# Patient Record
Sex: Female | Born: 1990 | Race: White | Hispanic: Yes | Marital: Married | Smoking: Never smoker
Health system: Southern US, Community
[De-identification: ages and names within clinical notes are randomized; demographics above are authoritative.]

## PROBLEM LIST (undated history)

## (undated) ENCOUNTER — Inpatient Hospital Stay (HOSPITAL_COMMUNITY): Payer: Self-pay

## (undated) DIAGNOSIS — J069 Acute upper respiratory infection, unspecified: Secondary | ICD-10-CM

## (undated) DIAGNOSIS — J45909 Unspecified asthma, uncomplicated: Secondary | ICD-10-CM

## (undated) HISTORY — DX: Unspecified asthma, uncomplicated: J45.909

## (undated) HISTORY — PX: TOENAIL EXCISION: SUR558

---
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
## 2011-04-08 ENCOUNTER — Inpatient Hospital Stay (INDEPENDENT_AMBULATORY_CARE_PROVIDER_SITE_OTHER)
Admission: RE | Admit: 2011-04-08 | Discharge: 2011-04-08 | Disposition: A | Discharge status: Home / Self Care | Payer: Self-pay | Source: Ambulatory Visit | Attending: Emergency Medicine | Admitting: Emergency Medicine

## 2011-04-08 DIAGNOSIS — L509 Urticaria, unspecified: Secondary | ICD-10-CM

## 2011-04-29 ENCOUNTER — Ambulatory Visit (INDEPENDENT_AMBULATORY_CARE_PROVIDER_SITE_OTHER): Payer: Self-pay | Admitting: Family Medicine

## 2011-04-29 ENCOUNTER — Encounter: Payer: Self-pay | Admitting: Family Medicine

## 2011-04-29 VITALS — BP 112/70 | HR 80 | Temp 98.1°F | Wt 165.6 lb

## 2011-04-29 DIAGNOSIS — J45909 Unspecified asthma, uncomplicated: Secondary | ICD-10-CM

## 2011-04-29 MED ORDER — ALBUTEROL SULFATE HFA 108 (90 BASE) MCG/ACT IN AERS: 2.0000 | INHALATION_SPRAY | RESPIRATORY_TRACT | Status: DC | PRN

## 2011-04-29 MED ORDER — AEROCHAMBER MAX W/MASK MEDIUM MISC: Status: DC

## 2011-04-29 MED ORDER — PREDNISONE 50 MG PO TABS: 50.0000 mg | ORAL_TABLET | Freq: Every day | ORAL | Status: DC

## 2011-04-29 MED ORDER — PREDNISONE 50 MG PO TABS: 50.0000 mg | ORAL_TABLET | Freq: Every day | ORAL | Status: AC

## 2011-04-29 MED ORDER — METHYLPREDNISOLONE SODIUM SUCC 40 MG IJ SOLR
40.0000 mg | Freq: Once | INTRAMUSCULAR | Status: AC
  Administered 2011-04-29: 40 mg via INTRAMUSCULAR

## 2011-04-29 MED ORDER — AEROCHAMBER MAX W/MASK MEDIUM MISC: Status: AC

## 2011-04-29 NOTE — Assessment & Plan Note (Signed)
Will give solumedrol dose IM and prescribe prednisone burst.  Also refill albuterol.  RTC for eval for more chronic need for inhaled steroids vs other controller.

## 2011-04-29 NOTE — Patient Instructions (Signed)
It was good to see you today! I am giving you some steroids by mouth to take for the next week. I want you to get back in to see [place] sometime in the next 2-3 weeks.  We can give you a flu shot at that time.

## 2011-04-29 NOTE — Progress Notes (Signed)
Subjective: Having problems with breathing, cough, sore throat, congestion, and back ache.  Has been bothering her for about 2 days.  Subjective fevers.  No N/V/D.  No sick contacts.  Is out of her albuterol and has been for some time.  Pt reports that she has had approximently 1 exacerbation every other year for the past number of years and has not had any recent hospitalizations for this condition.  Objective:  Filed Vitals:   04/29/11 1340  BP: 112/70  Pulse: 80  Temp: 98.1 F (36.7 C)   Gen: NAD CV: RRR Resp: Wheezes throughout, no focal findings, good air movement.  Assessment/Plan: Will give solumedrol dose IM and prescribe prednisone burst.  Also refill albuterol.  RTC for eval for more chronic need for inhaled steroids vs other controller. Please also see individual problems in problem list for problem-specific plans.

## 2011-05-18 ENCOUNTER — Ambulatory Visit (INDEPENDENT_AMBULATORY_CARE_PROVIDER_SITE_OTHER): Payer: Self-pay

## 2011-05-18 DIAGNOSIS — Z23 Encounter for immunization: Secondary | ICD-10-CM

## 2011-05-20 ENCOUNTER — Ambulatory Visit: Payer: Self-pay | Admitting: Family Medicine

## 2011-08-17 ENCOUNTER — Encounter: Payer: Self-pay | Admitting: Family Medicine

## 2011-08-17 ENCOUNTER — Ambulatory Visit (INDEPENDENT_AMBULATORY_CARE_PROVIDER_SITE_OTHER): Payer: Self-pay | Admitting: Family Medicine

## 2011-08-17 VITALS — BP 104/61 | HR 67 | Temp 98.1°F | Ht 66.0 in | Wt 166.9 lb

## 2011-08-17 DIAGNOSIS — R51 Headache: Secondary | ICD-10-CM

## 2011-08-17 MED ORDER — IBUPROFEN 800 MG PO TABS: 800.0000 mg | ORAL_TABLET | Freq: Three times a day (TID) | ORAL | Status: AC | PRN

## 2011-08-17 MED ORDER — SUMATRIPTAN SUCCINATE 50 MG PO TABS: 50.0000 mg | ORAL_TABLET | Freq: Once | ORAL | Status: DC | PRN

## 2011-08-17 NOTE — Progress Notes (Signed)
  Subjective:    Patient ID: Arbutus Leas, female    DOB: Aug 02, 1991, 21 y.o.   MRN: [id number]  HPI #1. Headaches: This is a new problem for [place]. She's been having headaches for the past several weeks. She describes 2 different types of headaches: The first of headache is described as unilateral and starts in the back of her head and neck. She states she has these every night. This can also be triggered by her children crying at home. These resolve the next morning. She has been taking Bayer migraine without relief.   She also describes a unilateral headache which can happen anytime during the day and often has no triggers. She does describe flashing lights on the right side of her vision prior to these headaches. These headaches often make her nauseous with associated photophobia and phonophobia. They last for several hours and are only relieved when she goes to lay down in a quiet dark room. She does have a father who suffers from migraines. No other family history. No actual vomiting. No abdominal pain.   Review of Systems See HPI above for review of systems.       Objective:   Physical Exam Gen:  Alert, cooperative patient who appears stated age in no acute distress.  Vital signs reviewed. Head:  [place]/AT Eyes:  PERRL, EOMI, fundoscopy WNL with normal cup to disc ratios Neck:  No thryomegaly. Cardiac:  Regular rate and rhythm without murmur auscultated.  Good S1/S2. Pulm:  Clear to auscultation bilaterally with good air movement.  No wheezes or rales noted.   Neuro:  Grossly normal without focal deficits      Assessment & Plan:

## 2011-08-17 NOTE — Patient Instructions (Signed)
It sounds like you have 2 different types of headaches:  Migraines and stress headaches.  I will give you some information about both.  Use the Imitrex if you see the funny lights before a migraine starts or as soon as it starts.  If the headache has already started, use the Ibuprofen for relief.    Migraine Headache  A migraine headache is an intense, throbbing pain on one or both sides of your head. The exact cause of a migraine headache is not always known. A migraine may be caused when nerves in the brain become irritated and release chemicals that cause swelling within blood vessels, causing pain. Many migraine sufferers have a family history of migraines. Before you get a migraine you may or may not get an aura. An aura is a group of symptoms that can predict the beginning of a migraine. An aura may include:  Visual changes such as:   Flashing lights.   Bright spots or zig-zag lines.   Tunnel vision.   Feelings of numbness.   Trouble talking.   Muscle weakness.  SYMPTOMS  Pain on one or both sides of your head.   Pain that is pulsating or throbbing in nature.   Pain that is severe enough to prevent daily activities.   Pain that is aggravated by any daily physical activity.   Nausea (feeling sick to your stomach), vomiting, or both.   Pain with exposure to bright lights, loud noises, or activity.   General sensitivity to bright lights or loud noises.  MIGRAINE TRIGGERS Examples of triggers of migraine headaches include:   Alcohol.   Smoking.   Stress.   It may be related to menses (female menstruation).   Aged cheeses.   Foods or drinks that contain nitrates, glutamate, aspartame, or tyramine.   Lack of sleep.   Chocolate.   Caffeine.   Hunger.   Medications such as nitroglycerine (used to treat chest pain), birth control pills, estrogen, and some blood pressure medications.  DIAGNOSIS  A migraine headache is often diagnosed based on:  Symptoms.     Physical examination.   A computerized X-ray scan (computed tomography, CT) of your head.  TREATMENT  Medications can help prevent migraines if they are recurrent or should they become recurrent. Your caregiver can help you with a medication or treatment program that will be helpful to you.   Lying down in a dark, quiet room may be helpful.   Keeping a headache diary may help you find a trend as to what may be triggering your headaches.  SEEK IMMEDIATE MEDICAL CARE IF:   You have confusion, personality changes or seizures.   You have headaches that wake you from sleep.   You have an increased frequency in your headaches.   You have a stiff neck.   You have a loss of vision.   You have muscle weakness.   You start losing your balance or have trouble walking.   You feel faint or pass out.  MAKE SURE YOU:   Understand these instructions.   Will watch your condition.   Will get help right away if you are not doing well or get worse.  Document Released: 07/25/2005 Document Revised: 04/06/2011 Document Reviewed: 03/10/2009 [facility] Patient Information 2012 [place], [place].   Tension Headache (Muscle Contraction Headache)  Tension headache is one of the most common causes of head pain. These headaches are usually felt as a pain over the top of your head and back of your neck.  Stress, anxiety, and depression are common triggers for these headaches. Tension headaches are not life-threatening and will not lead to other types of headaches. Tension headaches can often be diagnosed by taking a history from the patient and a physical exam. Sometimes, further lab and x-ray studies are used to confirm the diagnosis. Your caregiver can advise you on how to get help solving problems that cause anxiety or stress. Antidepressants can be prescribed if depression is a problem. HOME CARE INSTRUCTIONS   If testing was done, call for your results. Remember, it is your responsibility to get the  results of all testing. Do not assume everything is fine because you do not hear from your caregiver.   Only take over-the-counter or prescription medicines for pain, discomfort, or fever as directed by your caregiver.   Biofeedback, massage, or other relaxation techniques may be helpful.   Ice packs or heat to the head and neck can be used. Use these three to four times per day or as needed.   Physical therapy may be a useful addition to treatment.   If headaches continue, even with therapy, you may need to think about lifestyle changes.   Avoid excessive use of pain killers, as rebound headaches can occur.  SEEK MEDICAL CARE IF:   You develop problems with medications prescribed.   You do not respond or get no relief from medications.   You have a change from the usual headache.   You develop nausea (feeling sick to your stomach) or vomiting.  SEEK IMMEDIATE MEDICAL CARE IF:   Your headache becomes severe.   You have an unexplained oral temperature above 102 F (38.9 C).   You develop a stiff neck.   You have loss of vision.   You have muscular weakness.   You have loss of muscular control.   You develop severe symptoms different from your first symptoms.   You start losing your balance or have trouble walking.   You feel faint or pass out.  MAKE SURE YOU:   Understand these instructions.   Will watch your condition.   Will get help right away if you are not doing well or get worse.  Document Released: 07/25/2005 Document Revised: 04/06/2011 Document Reviewed: 03/13/2008 [facility] Patient Information 2012 [place], [place].

## 2011-08-17 NOTE — Assessment & Plan Note (Signed)
I would say that she is suffering from 2 different headaches: Migraines and tension headaches. I gave her a handout about these 2 different types of headaches. I gave her a prescription for Imitrex for the migraines and prescription of ibuprofen for the tension headaches. We did discuss the symptoms of these 2 types of headaches and when to use each of the medications. She expressed understanding. No red flags by history or exam. She is to followup if she has any worsening or nonresolution of symptoms.

## 2012-07-16 ENCOUNTER — Ambulatory Visit: Payer: Self-pay | Admitting: Family Medicine

## 2012-12-17 ENCOUNTER — Encounter (HOSPITAL_COMMUNITY): Payer: Self-pay | Admitting: *Deleted

## 2012-12-17 ENCOUNTER — Inpatient Hospital Stay (HOSPITAL_COMMUNITY)
Admission: AD | Admit: 2012-12-17 | Discharge: 2012-12-17 | Disposition: A | Discharge status: Home / Self Care | Payer: Self-pay | Source: Ambulatory Visit | Attending: Obstetrics & Gynecology | Admitting: Obstetrics & Gynecology

## 2012-12-17 DIAGNOSIS — K625 Hemorrhage of anus and rectum: Secondary | ICD-10-CM | POA: Insufficient documentation

## 2012-12-17 DIAGNOSIS — K644 Residual hemorrhoidal skin tags: Secondary | ICD-10-CM | POA: Insufficient documentation

## 2012-12-17 HISTORY — DX: Acute upper respiratory infection, unspecified: J06.9

## 2012-12-17 LAB — URINE MICROSCOPIC-ADD ON

## 2012-12-17 LAB — URINALYSIS, ROUTINE W REFLEX MICROSCOPIC
Bilirubin Urine: NEGATIVE
Glucose, UA: NEGATIVE mg/dL
Ketones, ur: 15 mg/dL — AB
Protein, ur: NEGATIVE mg/dL

## 2012-12-17 LAB — POCT PREGNANCY, URINE: Preg Test, Ur: NEGATIVE

## 2012-12-17 MED ORDER — DOCUSATE SODIUM 100 MG PO CAPS: 100.0000 mg | ORAL_CAPSULE | Freq: Two times a day (BID) | ORAL | Status: DC

## 2012-12-17 NOTE — MAU Provider Note (Signed)
History     CSN: [id number]  Arrival date and time: 12/17/12 2030   First Provider Initiated Contact with Patient 12/17/12 2201      Chief Complaint  Patient presents with  . Rectal Bleeding   HPI Ms. Natalie Morgan is a 22 y.o. G1P1001 who presents to MAU today with complaint of rectal bleeding. The patient states that this occurs only with BM. She has BM every 2 days, which is not normal for her. This started last week. She had mild abdominal pain with the first episode, but none since. She denies changes in diet. She feels well hydrated. She denies melana, diarrhea or heartburn. She has some pain with BM, but not every time. She is a patient of [facility]. She called them today and they did not return her call.    OB History   Grav Para Term Preterm Abortions TAB SAB Ect Mult Living   1 1 1       1       Past Medical History  Diagnosis Date  . Recurrent upper respiratory infection (URI)     Past Surgical History  Procedure Laterality Date  . Toenail excision      Family History  Problem Relation Age of Onset  . Cancer Maternal Aunt     History  Substance Use Topics  . Smoking status: Never Smoker   . Smokeless tobacco: Not on file  . Alcohol Use: No    Allergies: No Known Allergies  Prescriptions prior to admission  Medication Sig Dispense Refill  . aspirin-acetaminophen-caffeine (EXCEDRIN MIGRAINE) 250-250-65 MG per tablet Take 1 tablet by mouth every 6 (six) hours as needed for pain.      Marland Kitchen albuterol (VENTOLIN HFA) 108 (90 BASE) MCG/ACT inhaler Inhale 2 puffs into the lungs as needed. For asthma  3.7 g  1  . SUMAtriptan (IMITREX) 50 MG tablet Take 1 tablet (50 mg total) by mouth once as needed for migraine.  20 tablet  1    Review of Systems  Constitutional: Negative for fever, chills and malaise/fatigue.  Gastrointestinal: Positive for nausea, constipation and blood in stool. Negative for heartburn, vomiting, abdominal pain, diarrhea and melena.   Genitourinary: Negative for dysuria, urgency and frequency.       Neg - vaginal bleeding   Physical Exam   Blood pressure 123/69, pulse 78, temperature 98.2 F (36.8 C), temperature source Oral, resp. rate 16, height 5\' 6"  (1.676 m), weight 170 lb 3.2 oz (77.202 kg), last menstrual period 11/26/2012.  Physical Exam  Constitutional: She is oriented to person, place, and time. She appears well-developed and well-nourished. No distress.  HENT:  Head: Normocephalic and atraumatic.  Cardiovascular: Normal rate, regular rhythm and normal heart sounds.   Respiratory: Effort normal and breath sounds normal. No respiratory distress.  GI: Soft. Bowel sounds are normal. She exhibits no distension and no mass. There is tenderness (very mild tenderness to palpation of the LLQ). There is no rebound and no guarding.  Genitourinary: Rectal exam shows external hemorrhoid. Rectal exam shows no fissure, no mass and no tenderness.  Neurological: She is alert and oriented to person, place, and time.  Skin: Skin is warm and dry. No erythema.  Psychiatric: She has a normal mood and affect.   Results for orders placed during the hospital encounter of 12/17/12 (from the past 24 hour(s))  URINALYSIS, ROUTINE W REFLEX MICROSCOPIC     Status: Abnormal   Collection Time    12/17/12  9:20 PM  Result Value Range   Color, Urine YELLOW  YELLOW   APPearance CLEAR  CLEAR   Specific Gravity, Urine >1.030 (*) 1.005 - 1.030   pH 5.5  5.0 - 8.0   Glucose, UA NEGATIVE  NEGATIVE mg/dL   Hgb urine dipstick TRACE (*) NEGATIVE   Bilirubin Urine NEGATIVE  NEGATIVE   Ketones, ur 15 (*) NEGATIVE mg/dL   Protein, ur NEGATIVE  NEGATIVE mg/dL   Urobilinogen, UA 0.2  0.0 - 1.0 mg/dL   Nitrite NEGATIVE  NEGATIVE   Leukocytes, UA SMALL (*) NEGATIVE  URINE MICROSCOPIC-ADD ON     Status: Abnormal   Collection Time    12/17/12  9:20 PM      Result Value Range   Squamous Epithelial / LPF FEW (*) RARE   WBC, UA 3-6  <3  WBC/hpf   RBC / HPF 0-2  <3 RBC/hpf   Bacteria, UA FEW (*) RARE  POCT PREGNANCY, URINE     Status: None   Collection Time    12/17/12  9:48 PM      Result Value Range   Preg Test, Ur NEGATIVE  NEGATIVE    MAU Course  Procedures None  MDM External hemorrhoid noted on exam. Discussed possible causes. Patient encouraged to follow-up at [facility] if symptoms worsen  Assessment and Plan  A: External hemorrhoid  P: Discharge home Rx for colace sent to patient's pharmacy Patient encouraged to use preparation H OTC for discomfort Patient encouraged to increase fiber in diet. High fiber content food information given on AVS Patient encouraged to increase PO hydration as tolerated Patient encouraged to follow-up with [facility] if symptoms worsen  Freddi Starr, PA-C  12/17/2012, 10:26 PM

## 2012-12-17 NOTE — MAU Note (Signed)
Pt LMP 11/26/2012, having rectal bleeding after each bowel movement x 1 wk.  Pt reports blood in her stools x 1 wk.

## 2012-12-18 LAB — URINE CULTURE

## 2012-12-21 ENCOUNTER — Ambulatory Visit (INDEPENDENT_AMBULATORY_CARE_PROVIDER_SITE_OTHER): Payer: PRIVATE HEALTH INSURANCE | Admitting: Family Medicine

## 2012-12-21 ENCOUNTER — Encounter: Payer: Self-pay | Admitting: Family Medicine

## 2012-12-21 VITALS — BP 120/80 | HR 72 | Ht 66.0 in | Wt 168.5 lb

## 2012-12-21 DIAGNOSIS — K649 Unspecified hemorrhoids: Secondary | ICD-10-CM | POA: Insufficient documentation

## 2012-12-21 DIAGNOSIS — Z862 Personal history of diseases of the blood and blood-forming organs and certain disorders involving the immune mechanism: Secondary | ICD-10-CM

## 2012-12-21 LAB — POCT HEMOGLOBIN: Hemoglobin: 13.5 g/dL (ref 12.2–16.2)

## 2012-12-21 NOTE — Assessment & Plan Note (Signed)
External hemorrhoid noted at acute visit for rectal bleeding at [facility].  Patient reports resolution of pain and bleeding with improvement in constipation with prune juice.  Advised miralax titration prn for soft BM's.

## 2012-12-21 NOTE — Progress Notes (Signed)
  Subjective:    Patient ID: Natalie Morgan, female    DOB: May 11, 1991, 22 y.o.   MRN: [id number]  HPIFollow-up Hemorrhoid dx at [facility]  For the past week has had increased constipation and noticed BRB with bowel movements around a stool. Brings in a pictures- showing red in toilet around a stool. Some pain with bowel movement.  Last bleeding 3 days ago.  No more pain.  Was evaluated at [facility] hospital for this, they noted external hemorrhoid and discharged with colace.  Typically has BM daily, recently every other day.  Hard BM's, has softened with prune juice.  Not taking colace or any other medications for constipation.    Has history of intermittent constipation since pregnancy.  This is her first episode of rectal bleeding.  Also noted she is not on contraception because she may try to conceive soon  Reports had history of anemia, would like this rechecked today.  Review of Systems See HPI     Objective:   Physical Exam GEN: NAD Rectal exam: deferred.      Assessment & Plan:

## 2012-12-21 NOTE — Patient Instructions (Addendum)
Use miralax daily or every other day- adjust dose to keep your stools soft every day  calll office for appointment if you notice more bleeding or pain  Take a prenatal vitamin every day  Make appointment for yearly checkup

## 2012-12-24 NOTE — MAU Provider Note (Signed)
Attestation of Attending Supervision of Advanced Practitioner (CNM/NP): Evaluation and management procedures were performed by the Advanced Practitioner under my supervision and collaboration. I have reviewed the Advanced Practitioner's note and chart, and I agree with the management and plan.  Ruthella Kirchman H. 4:26 PM   

## 2013-01-15 ENCOUNTER — Encounter: Payer: Self-pay | Admitting: Family Medicine

## 2013-03-27 ENCOUNTER — Other Ambulatory Visit: Payer: PRIVATE HEALTH INSURANCE

## 2013-03-27 DIAGNOSIS — Z331 Pregnant state, incidental: Secondary | ICD-10-CM

## 2013-03-27 LAB — OB RESULTS CONSOLE GC/CHLAMYDIA
CHLAMYDIA, DNA PROBE: NEGATIVE
Gonorrhea: NEGATIVE

## 2013-03-27 NOTE — Progress Notes (Signed)
OB LABS DONE TODAY Natalie Morgan 

## 2013-03-29 LAB — OBSTETRIC PANEL
Antibody Screen: NEGATIVE
Basophils Relative: 0 % (ref 0–1)
HCT: 36.2 % (ref 36.0–46.0)
Hemoglobin: 12.3 g/dL (ref 12.0–15.0)
MCHC: 34 g/dL (ref 30.0–36.0)
Monocytes Absolute: 0.6 10*3/uL (ref 0.1–1.0)
Monocytes Relative: 7 % (ref 3–12)
Neutro Abs: 6.1 10*3/uL (ref 1.7–7.7)
Rh Type: POSITIVE
Rubella: 2.12 Index — ABNORMAL HIGH (ref <0.90)

## 2013-03-29 LAB — CULTURE, OB URINE: Colony Count: 50000

## 2013-04-03 ENCOUNTER — Ambulatory Visit (INDEPENDENT_AMBULATORY_CARE_PROVIDER_SITE_OTHER): Payer: PRIVATE HEALTH INSURANCE | Admitting: Sports Medicine

## 2013-04-03 ENCOUNTER — Encounter: Payer: Self-pay | Admitting: Sports Medicine

## 2013-04-03 ENCOUNTER — Other Ambulatory Visit (HOSPITAL_COMMUNITY)
Admission: RE | Admit: 2013-04-03 | Discharge: 2013-04-03 | Disposition: A | Discharge status: Home / Self Care | Payer: PRIVATE HEALTH INSURANCE | Source: Ambulatory Visit | Attending: Sports Medicine | Admitting: Sports Medicine

## 2013-04-03 VITALS — BP 102/57 | Temp 98.1°F | Wt 169.1 lb

## 2013-04-03 DIAGNOSIS — Z3481 Encounter for supervision of other normal pregnancy, first trimester: Secondary | ICD-10-CM

## 2013-04-03 DIAGNOSIS — Z3480 Encounter for supervision of other normal pregnancy, unspecified trimester: Secondary | ICD-10-CM | POA: Insufficient documentation

## 2013-04-03 DIAGNOSIS — Z113 Encounter for screening for infections with a predominantly sexual mode of transmission: Secondary | ICD-10-CM | POA: Insufficient documentation

## 2013-04-03 MED ORDER — PRENATAL PLUS 27-1 MG PO TABS: 1.0000 | ORAL_TABLET | Freq: Every day | ORAL | Status: DC

## 2013-04-03 NOTE — Progress Notes (Signed)
Pt is a 22 y.o. G2P1001 [redacted]w[redacted]d by LMP who presents today for initial prenatal visit.  Subjective:    Natalie Morgan is being seen today for her first obstetrical visit.  This is a planned pregnancy. She is at [redacted]w[redacted]d gestation. Her obstetrical history is significant for none. Relationship with FOB: spouse, living together. Patient does intend to breast feed. Pregnancy history fully reviewed.  Patient reports no complaints.  Review of Systems:   Review of Systems  Constitutional: Negative.   HENT: Negative.   Eyes: Negative.   Respiratory: Negative.   Cardiovascular: Negative.   Gastrointestinal: Negative.   Endocrine: Negative.   Genitourinary: Negative.   Musculoskeletal: Negative.   Skin: Negative.   Allergic/Immunologic: Negative.   Neurological: Negative.  Negative for headaches.  Hematological: Negative.   Psychiatric/Behavioral: Negative.     Objective:     BP 102/57  Temp(Src) 98.1 F (36.7 C)  Wt 169 lb 1.6 oz (76.703 kg)  BMI 27.31 kg/m2  LMP 01/25/2013 Physical Exam  Nursing note and vitals reviewed. Constitutional: She is oriented to person, place, and time. She appears well-developed and well-nourished. No distress.  HENT:  Head: Normocephalic and atraumatic.  Eyes: Conjunctivae are normal. Pupils are equal, round, and reactive to light.  Neck: Normal range of motion. No JVD present. No tracheal deviation present.  Cardiovascular: Normal rate.  Exam reveals no gallop and no friction rub.   No murmur heard. Respiratory: Effort normal. No respiratory distress. She has no wheezes. She has no rales.  GI: Soft. She exhibits distension (Gravid).  Musculoskeletal: She exhibits no edema and no tenderness.  Neurological: She is alert and oriented to person, place, and time. She exhibits normal muscle tone.  Skin: Skin is warm and dry. No rash noted. She is not diaphoretic. No erythema. No pallor.  Psychiatric: She has a normal mood and affect. Her behavior is  normal. Judgment and thought content normal.    Exam Chaperone: CMA  External: no skin lesions, normal appearing genitalia, minimal discharge  Vagina: no vaginal lesion, vaginal mucosa moist   Cervix:  cervical ectropion, no CMT  Uterus: normal size, shape, consistency  non-tender  Adenexa: no masses, non-tender  TESTS:  PAP SMEAR, GC Chlamydia      Assessment:    Pregnancy: G2P1001 Normal low risk pregnancy     Plan:     Initial labs drawn previously N. reviewed Prenatal vitamins. Problem list reviewed and updated. AFP3 discussed: requested. Role of ultrasound in pregnancy discussed; fetal survey: requested.  Will likely need be done at [facility] Amniocentesis discussed: not indicated. Follow up in 3 weeks. 50% of 45 min visit spent on counseling and coordination of care.  Refilled Prenatal Vitamins   Patient will need Varicella titer at 16 week blood draw and flu shot at next visit - she deferred this today.  Chi Garlow 04/03/2013

## 2013-04-03 NOTE — Patient Instructions (Addendum)
I have refilled your prenatal vitamin. Please follow up in 2-3 weeks for your 12 week visit  ABCs of Pregnancy A Antepartum care is very important. Be sure you see your doctor and get prenatal care as soon as you think you are pregnant. At this time, you will be tested for infection, genetic abnormalities and potential problems with you and the pregnancy. This is the time to discuss diet, exercise, work, medications, labor, pain medication during labor and the possibility of a cesarean delivery. Ask any questions that may concern you. It is important to see your doctor regularly throughout your pregnancy. Avoid exposure to toxic substances and chemicals - such as cleaning solvents, lead and mercury, some insecticides, and paint. Pregnant women should avoid exposure to paint fumes, and fumes that cause you to feel ill, dizzy or faint. When possible, it is a good idea to have a pre-pregnancy consultation with your caregiver to begin some important recommendations your caregiver suggests such as, taking folic acid, exercising, quitting smoking, avoiding alcoholic beverages, etc. B Breastfeeding is the healthiest choice for both you and your baby. It has many nutritional benefits for the baby and health benefits for the mother. It also creates a very tight and loving bond between the baby and mother. Talk to your doctor, your family and friends, and your employer about how you choose to feed your baby and how they can support you in your decision. Not all birth defects can be prevented, but a woman can take actions that may increase her chance of having a healthy baby. Many birth defects happen very early in pregnancy, sometimes before a woman even knows she is pregnant. Birth defects or abnormalities of any child in your or the father's family should be discussed with your caregiver. Get a good support bra as your breast size changes. Wear it especially when you exercise and when nursing.  C Celebrate the news  of your pregnancy with the your spouse/father and family. Childbirth classes are helpful to take for you and the spouse/father because it helps to understand what happens during the pregnancy, labor and delivery. Cesarean delivery should be discussed with your doctor so you are prepared for that possibility. The pros and cons of circumcision if it is a boy, should be discussed with your pediatrician. Cigarette smoking during pregnancy can result in low birth weight babies. It has been associated with infertility, miscarriages, tubal pregnancies, infant death (mortality) and poor health (morbidity) in childhood. Additionally, cigarette smoking may cause long-term learning disabilities. If you smoke, you should try to quit before getting pregnant and not smoke during the pregnancy. Secondary smoke may also harm a mother and her developing baby. It is a good idea to ask people to stop smoking around you during your pregnancy and after the baby is born. Extra calcium is necessary when you are pregnant and is found in your prenatal vitamin, in dairy products, green leafy vegetables and in calcium supplements. D A healthy diet according to your current weight and height, along with vitamins and mineral supplements should be discussed with your caregiver. Domestic abuse or violence should be made known to your doctor right away to get the situation corrected. Drink more water when you exercise to keep hydrated. Discomfort of your back and legs usually develops and progresses from the middle of the second trimester through to delivery of the baby. This is because of the enlarging baby and uterus, which may also affect your balance. Do not take illegal drugs. Illegal  drugs can seriously harm the baby and you. Drink extra fluids (water is best) throughout pregnancy to help your body keep up with the increases in your blood volume. Drink at least 6 to 8 glasses of water, fruit juice, or milk each day. A good way to know you  are drinking enough fluid is when your urine looks almost like clear water or is very light yellow.  E Eat healthy to get the nutrients you and your unborn baby need. Your meals should include the five basic food groups. Exercise (30 minutes of light to moderate exercise a day) is important and encouraged during pregnancy, if there are no medical problems or problems with the pregnancy. Exercise that causes discomfort or dizziness should be stopped and reported to your caregiver. Emotions during pregnancy can change from being ecstatic to depression and should be understood by you, your partner and your family. F Fetal screening with ultrasound, amniocentesis and monitoring during pregnancy and labor is common and sometimes necessary. Take 400 micrograms of folic acid daily both before, when possible, and during the first few months of pregnancy to reduce the risk of birth defects of the brain and spine. All women who could possibly become pregnant should take a vitamin with folic acid, every day. It is also important to eat a healthy diet with fortified foods (enriched grain products, including cereals, rice, breads, and pastas) and foods with natural sources of folate (orange juice, green leafy vegetables, beans, peanuts, broccoli, asparagus, peas, and lentils). The father should be involved with all aspects of the pregnancy including, the prenatal care, childbirth classes, labor, delivery, and postpartum time. Fathers may also have emotional concerns about being a father, financial needs, and raising a family. G Genetic testing should be done appropriately. It is important to know your family and the father's history. If there have been problems with pregnancies or birth defects in your family, report these to your doctor. Also, genetic counselors can talk with you about the information you might need in making decisions about having a family. You can call a major medical center in your area for help in  finding a board-certified genetic counselor. Genetic testing and counseling should be done before pregnancy when possible, especially if there is a history of problems in the mother's or father's family. Certain ethnic backgrounds are more at risk for genetic defects. H Get familiar with the hospital where you will be having your baby. Get to know how long it takes to get there, the labor and delivery area, and the hospital procedures. Be sure your medical insurance is accepted there. Get your home ready for the baby including, clothes, the baby's room (when possible), furniture and car seat. Hand washing is important throughout the day, especially after handling raw meat and poultry, changing the baby's diaper or using the bathroom. This can help prevent the spread of many bacteria and viruses that cause infection. Your hair may become dry and thinner, but will return to normal a few weeks after the baby is born. Heartburn is a common problem that can be treated by taking antacids recommended by your caregiver, eating smaller meals 5 or 6 times a day, not drinking liquids when eating, drinking between meals and raising the head of your bed 2 to 3 inches. I Insurance to cover you, the baby, doctor and hospital should be reviewed so that you will be prepared to pay any costs not covered by your insurance plan. If you do not have medical insurance,  there are usually clinics and services available for you in your community. Take 30 milligrams of iron during your pregnancy as prescribed by your doctor to reduce the risk of low red blood cells (anemia) later in pregnancy. All women of childbearing age should eat a diet rich in iron. J There should be a joint effort for the mother, father and any other children to adapt to the pregnancy financially, emotionally, and psychologically during the pregnancy. Join a support group for moms-to-be. Or, join a class on parenting or childbirth. Have the family participate when  possible. K Know your limits. Let your caregiver know if you experience any of the following:   Pain of any kind.  Strong cramps.  You develop a lot of weight in a short period of time (5 pounds in 3 to 5 days).  Vaginal bleeding, leaking of amniotic fluid.  Headache, vision problems.  Dizziness, fainting, shortness of breath.  Chest pain.  Fever of 102 F (38.9 C) or higher.  Gush of clear fluid from your vagina.  Painful urination.  Domestic violence.  Irregular heartbeat (palpitations).  Rapid beating of the heart (tachycardia).  Constant feeling sick to your stomach (nauseous) and vomiting.  Trouble walking, fluid retention (edema).  Muscle weakness.  If your baby has decreased activity.  Persistent diarrhea.  Abnormal vaginal discharge.  Uterine contractions at 20-minute intervals.  Back pain that travels down your leg. L Learn and practice that what you eat and drink should be in moderation and healthy for you and your baby. Legal drugs such as alcohol and caffeine are important issues for pregnant women. There is no safe amount of alcohol a woman can drink while pregnant. Fetal alcohol syndrome, a disorder characterized by growth retardation, facial abnormalities, and central nervous system dysfunction, is caused by a woman's use of alcohol during pregnancy. Caffeine, found in tea, coffee, soft drinks and chocolate, should also be limited. Be sure to read labels when trying to cut down on caffeine during pregnancy. More than 200 foods, beverages, and over-the-counter medications contain caffeine and have a high salt content! There are coffees and teas that do not contain caffeine. M Medical conditions such as diabetes, epilepsy, and high blood pressure should be treated and kept under control before pregnancy when possible, but especially during pregnancy. Ask your caregiver about any medications that may need to be changed or adjusted during pregnancy. If you  are currently taking any medications, ask your caregiver if it is safe to take them while you are pregnant or before getting pregnant when possible. Also, be sure to discuss any herbs or vitamins you are taking. They are medicines, too! Discuss with your doctor all medications, prescribed and over-the-counter, that you are taking. During your prenatal visit, discuss the medications your doctor may give you during labor and delivery. N Never be afraid to ask your doctor or caregiver questions about your health, the progress of the pregnancy, family problems, stressful situations, and recommendation for a pediatrician, if you do not have one. It is better to take all precautions and discuss any questions or concerns you may have during your office visits. It is a good idea to write down your questions before you visit the doctor. O Over-the-counter cough and cold remedies may contain alcohol or other ingredients that should be avoided during pregnancy. Ask your caregiver about prescription, herbs or over-the-counter medications that you are taking or may consider taking while pregnant.  P Physical activity during pregnancy can benefit both you  and your baby by lessening discomfort and fatigue, providing a sense of well-being, and increasing the likelihood of early recovery after delivery. Light to moderate exercise during pregnancy strengthens the belly (abdominal) and back muscles. This helps improve posture. Practicing yoga, walking, swimming, and cycling on a stationary bicycle are usually safe exercises for pregnant women. Avoid scuba diving, exercise at high altitudes (over 3000 feet), skiing, horseback riding, contact sports, etc. Always check with your doctor before beginning any kind of exercise, especially during pregnancy and especially if you did not exercise before getting pregnant. Q Queasiness, stomach upset and morning sickness are common during pregnancy. Eating a couple of crackers or dry  toast before getting out of bed. Foods that you normally love may make you feel sick to your stomach. You may need to substitute other nutritious foods. Eating 5 or 6 small meals a day instead of 3 large ones may make you feel better. Do not drink with your meals, drink between meals. Questions that you have should be written down and asked during your prenatal visits. R Read about and make plans to baby-proof your home. There are important tips for making your home a safer environment for your baby. Review the tips and make your home safer for you and your baby. Read food labels regarding calories, salt and fat content in the food. S Saunas, hot tubs, and steam rooms should be avoided while you are pregnant. Excessive high heat may be harmful during your pregnancy. Your caregiver will screen and examine you for sexually transmitted diseases and genetic disorders during your prenatal visits. Learn the signs of labor. Sexual relations while pregnant is safe unless there is a medical or pregnancy problem and your caregiver advises against it. T Traveling long distances should be avoided especially in the third trimester of your pregnancy. If you do have to travel out of state, be sure to take a copy of your medical records and medical insurance plan with you. You should not travel long distances without seeing your doctor first. Most airlines will not allow you to travel after 36 weeks of pregnancy. Toxoplasmosis is an infection caused by a parasite that can seriously harm an unborn baby. Avoid eating undercooked meat and handling cat litter. Be sure to wear gloves when gardening. Tingling of the hands and fingers is not unusual and is due to fluid retention. This will go away after the baby is born. U Womb (uterus) size increases during the first trimester. Your kidneys will begin to function more efficiently. This may cause you to feel the need to urinate more often. You may also leak urine when sneezing,  coughing or laughing. This is due to the growing uterus pressing against your bladder, which lies directly in front of and slightly under the uterus during the first few months of pregnancy. If you experience burning along with frequency of urination or bloody urine, be sure to tell your doctor. The size of your uterus in the third trimester may cause a problem with your balance. It is advisable to maintain good posture and avoid wearing high heels during this time. An ultrasound of your baby may be necessary during your pregnancy and is safe for you and your baby. V Vaccinations are an important concern for pregnant women. Get needed vaccines before pregnancy. Center for Disease Control ([employer]) has clear guidelines for the use of vaccines during pregnancy. Review the list, be sure to discuss it with your doctor. Prenatal vitamins are helpful and healthy for  you and the baby. Do not take extra vitamins except what is recommended. Taking too much of certain vitamins can cause overdose problems. Continuous vomiting should be reported to your caregiver. Varicose veins may appear especially if there is a family history of varicose veins. They should subside after the delivery of the baby. Support hose helps if there is leg discomfort. W Being overweight or underweight during pregnancy may cause problems. Try to get within 15 pounds of your ideal weight before pregnancy. Remember, pregnancy is not a time to be dieting! Do not stop eating or start skipping meals as your weight increases. Both you and your baby need the calories and nutrition you receive from a healthy diet. Be sure to consult with your doctor about your diet. There is a formula and diet plan available depending on whether you are overweight or underweight. Your caregiver or nutritionist can help and advise you if necessary. X Avoid X-rays. If you must have dental work or diagnostic tests, tell your dentist or physician that you are pregnant so  that extra care can be taken. X-rays should only be taken when the risks of not taking them outweigh the risk of taking them. If needed, only the minimum amount of radiation should be used. When X-rays are necessary, protective lead shields should be used to cover areas of the body that are not being X-rayed. Y Your baby loves you. Breastfeeding your baby creates a loving and very close bond between the two of you. Give your baby a healthy environment to live in while you are pregnant. Infants and children require constant care and guidance. Their health and safety should be carefully watched at all times. After the baby is born, rest or take a nap when the baby is sleeping. Z Get your ZZZs. Be sure to get plenty of rest. Resting on your side as often as possible, especially on your left side is advised. It provides the best circulation to your baby and helps reduce swelling. Try taking a nap for 30 to 45 minutes in the afternoon when possible. After the baby is born rest or take a nap when the baby is sleeping. Try elevating your feet for that amount of time when possible. It helps the circulation in your legs and helps reduce swelling.  Most information courtesy of the CDC. Document Released: 07/25/2005 Document Revised: 10/17/2011 Document Reviewed: 04/08/2009 [facility] Patient Information 2014 [place], [place].

## 2013-04-05 LAB — CULTURE, OB URINE
Colony Count: NO GROWTH
Organism ID, Bacteria: NO GROWTH

## 2013-04-09 ENCOUNTER — Encounter: Payer: Self-pay | Admitting: Sports Medicine

## 2013-04-15 ENCOUNTER — Telehealth: Payer: Self-pay | Admitting: Family Medicine

## 2013-04-15 MED ORDER — PRENATAL PLUS 27-1 MG PO TABS: 1.0000 | ORAL_TABLET | Freq: Every day | ORAL | Status: DC

## 2013-04-15 NOTE — Telephone Encounter (Signed)
Pt called because [facility] is stating that we di not send a prescription for pre-natal vitamins, it shows that we did on the 8/27, can we resend this JW

## 2013-04-15 NOTE — Telephone Encounter (Signed)
Resent to [facility] on [email or web address]. [place], [place]

## 2013-04-24 ENCOUNTER — Ambulatory Visit (INDEPENDENT_AMBULATORY_CARE_PROVIDER_SITE_OTHER): Payer: PRIVATE HEALTH INSURANCE | Admitting: Family Medicine

## 2013-04-24 VITALS — BP 110/67 | Wt 170.0 lb

## 2013-04-24 DIAGNOSIS — Z3481 Encounter for supervision of other normal pregnancy, first trimester: Secondary | ICD-10-CM

## 2013-04-24 DIAGNOSIS — Z3480 Encounter for supervision of other normal pregnancy, unspecified trimester: Secondary | ICD-10-CM

## 2013-04-24 NOTE — Patient Instructions (Addendum)
Pregnancy - First Trimester During sexual intercourse, millions of sperm go into the vagina. Only 1 sperm will penetrate and fertilize the female egg while it is in the Fallopian tube. One week later, the fertilized egg implants into the wall of the uterus. An embryo begins to develop into a baby. At 6 to 8 weeks, the eyes and face are formed and the heartbeat can be seen on ultrasound. At the end of 12 weeks (first trimester), all the baby's organs are formed. Now that you are pregnant, you will want to do everything you can to have a healthy baby. Two of the most important things are to get good prenatal care and follow your caregiver's instructions. Prenatal care is all the medical care you receive before the baby's birth. It is given to prevent, find, and treat problems during the pregnancy and childbirth. PRENATAL EXAMS  During prenatal visits, your weight, blood pressure, and urine are checked. This is done to make sure you are healthy and progressing normally during the pregnancy.  A pregnant woman should gain 25 to 35 pounds during the pregnancy. However, if you are overweight or underweight, your caregiver will advise you regarding your weight.  Your caregiver will ask and answer questions for you.  Blood work, cervical cultures, other necessary tests, and a Pap test are done during your prenatal exams. These tests are done to check on your health and the probable health of your baby. Tests are strongly recommended and done for HIV with your permission. This is the virus that causes AIDS. These tests are done because medicines can be given to help prevent your baby from being born with this infection should you have been infected without knowing it. Blood work is also used to find out your blood type, previous infections, and follow your blood levels (hemoglobin).  Low hemoglobin (anemia) is common during pregnancy. Iron and vitamins are given to help prevent this. Later in the pregnancy, blood  tests for diabetes will be done along with any other tests if any problems develop.  You may need other tests to make sure you and the baby are doing well. CHANGES DURING THE FIRST TRIMESTER  Your body goes through many changes during pregnancy. They vary from person to person. Talk to your caregiver about changes you notice and are concerned about. Changes can include:  Your menstrual period stops.  The egg and sperm carry the genes that determine what you look like. Genes from you and your partner are forming a baby. The female genes determine whether the baby is a boy or a girl.  Your body increases in girth and you may feel bloated.  Feeling sick to your stomach (nauseous) and throwing up (vomiting). If the vomiting is uncontrollable, call your caregiver.  Your breasts will begin to enlarge and become tender.  Your nipples may stick out more and become darker.  The need to urinate more. Painful urination may mean you have a bladder infection.  Tiring easily.  Loss of appetite.  Cravings for certain kinds of food.  At first, you may gain or lose a couple of pounds.  You may have changes in your emotions from day to day (excited to be pregnant or concerned something may go wrong with the pregnancy and baby).  You may have more vivid and strange dreams. HOME CARE INSTRUCTIONS   It is very important to avoid all smoking, alcohol and non-prescribed drugs during your pregnancy. These affect the formation and growth of the baby.   Avoid chemicals while pregnant to ensure the delivery of a healthy infant.  Start your prenatal visits by the 12th week of pregnancy. They are usually scheduled monthly at first, then more often in the last 2 months before delivery. Keep your caregiver's appointments. Follow your caregiver's instructions regarding medicine use, blood and lab tests, exercise, and diet.  During pregnancy, you are providing food for you and your baby. Eat regular, well-balanced  meals. Choose foods such as meat, fish, milk and other low fat dairy products, vegetables, fruits, and whole-grain breads and cereals. Your caregiver will tell you of the ideal weight gain.  You can help morning sickness by keeping soda crackers at the bedside. Eat a couple before arising in the morning. You may want to use the crackers without salt on them.  Eating 4 to 5 small meals rather than 3 large meals a day also may help the nausea and vomiting.  Drinking liquids between meals instead of during meals also seems to help nausea and vomiting.  A physical sexual relationship may be continued throughout pregnancy if there are no other problems. Problems may be early (premature) leaking of amniotic fluid from the membranes, vaginal bleeding, or belly (abdominal) pain.  Exercise regularly if there are no restrictions. Check with your caregiver or physical therapist if you are unsure of the safety of some of your exercises. Greater weight gain will occur in the last 2 trimesters of pregnancy. Exercising will help:  Control your weight.  Keep you in shape.  Prepare you for labor and delivery.  Help you lose your pregnancy weight after you deliver your baby.  Wear a good support or jogging bra for breast tenderness during pregnancy. This may help if worn during sleep too.  Ask when prenatal classes are available. Begin classes when they are offered.  Do not use hot tubs, steam rooms, or saunas.  Wear your seat belt when driving. This protects you and your baby if you are in an accident.  Avoid raw meat, uncooked cheese, cat litter boxes, and soil used by cats throughout the pregnancy. These carry germs that can cause birth defects in the baby.  The first trimester is a good time to visit your dentist for your dental health. Getting your teeth cleaned is okay. Use a softer toothbrush and brush gently during pregnancy.  Ask for help if you have financial, counseling, or nutritional needs  during pregnancy. Your caregiver will be able to offer counseling for these needs as well as refer you for other special needs.  Do not take any medicines or herbs unless told by your caregiver.  Inform your caregiver if there is any mental or physical domestic violence.  Make a list of emergency phone numbers of family, friends, hospital, and police and fire departments.  Write down your questions. Take them to your prenatal visit.  Do not douche.  Do not cross your legs.  If you have to stand for long periods of time, rotate you feet or take small steps in a circle.  You may have more vaginal secretions that may require a sanitary pad. Do not use tampons or scented sanitary pads. MEDICINES AND DRUG USE IN PREGNANCY  Take prenatal vitamins as directed. The vitamin should contain 1 milligram of folic acid. Keep all vitamins out of reach of children. Only a couple vitamins or tablets containing iron may be fatal to a baby or young child when ingested.  Avoid use of all medicines, including herbs, over-the-counter medicines, not   prescribed or suggested by your caregiver. Only take over-the-counter or prescription medicines for pain, discomfort, or fever as directed by your caregiver. Do not use aspirin, ibuprofen, or naproxen unless directed by your caregiver.  Let your caregiver also know about herbs you may be using.  Alcohol is related to a number of birth defects. This includes fetal alcohol syndrome. All alcohol, in any form, should be avoided completely. Smoking will cause low birth rate and premature babies.  Street or illegal drugs are very harmful to the baby. They are absolutely forbidden. A baby born to an addicted mother will be addicted at birth. The baby will go through the same withdrawal an adult does.  Let your caregiver know about any medicines that you have to take and for what reason you take them. SEEK MEDICAL CARE IF:  You have any concerns or worries during your  pregnancy. It is better to call with your questions if you feel they cannot wait, rather than worry about them. SEEK IMMEDIATE MEDICAL CARE IF:   An unexplained oral temperature above 102 F (38.9 C) develops, or as your caregiver suggests.  You have leaking of fluid from the vagina (birth canal). If leaking membranes are suspected, take your temperature and inform your caregiver of this when you call.  There is vaginal spotting or bleeding. Notify your caregiver of the amount and how many pads are used.  You develop a bad smelling vaginal discharge with a change in the color.  You continue to feel sick to your stomach (nauseated) and have no relief from remedies suggested. You vomit blood or coffee ground-like materials.  You lose more than 2 pounds of weight in 1 week.  You gain more than 2 pounds of weight in 1 week and you notice swelling of your face, hands, feet, or legs.  You gain 5 pounds or more in 1 week (even if you do not have swelling of your hands, face, legs, or feet).  You get exposed to [place] measles and have never had them.  You are exposed to fifth disease or chickenpox.  You develop belly (abdominal) pain. Round ligament discomfort is a common non-cancerous (benign) cause of abdominal pain in pregnancy. Your caregiver still must evaluate this.  You develop headache, fever, diarrhea, pain with urination, or shortness of breath.  You fall or are in a car accident or have any kind of trauma.  There is mental or physical violence in your home. Document Released: 07/19/2001 Document Revised: 04/18/2012 Document Reviewed: 01/20/2009 [facility] Patient Information 2014 [place], [place].  Based on your BMI: your weight gain during the pregnancy needs to be between 15 to 25lbs.    Will I need to change the way I eat when I am pregnant? - Probably. In fact, you will probably need to change the way you eat before you get pregnant. You will also need to start taking a  multivitamin that has folic acid in it.  If you want to get pregnant, see your doctor or nurse before you start trying. He or she will explain how your diet needs to change and outline the steps you can take to have the healthiest pregnancy possible. Eating the right foods will help your baby's development. Your baby will need nutrients from these foods to form normally and grow.  Eating the wrong foods could harm your baby. For example, if you eat cheese made from unpasteurized milk or raw or undercooked meat, you could get an infection that could lead to  a miscarriage. Likewise, if you take too much vitamin A (more than 10,000 international units a day) in a vitamin supplement, your baby could be born with birth defects.  Making healthy food choices is also important for your health as a mother. As your baby grows and changes inside you, it will take nutrients from your body. You will have to replace these nutrients to stay healthy and have all the energy you need.  Which foods should I eat? - The best diet for you and your baby will include lots of fresh fruits, vegetables, and whole grains, some low-fat dairy products, and a few sources of protein, such as meat, fish, eggs, or dried peas or beans. If you do not eat dairy foods, you will need to get calcium from other sources. If you are a vegetarian, speak to a nutritionist (food expert) about your food choices. Vegetarian diets can sometimes be missing nutrients that are important for a growing baby. Should I prepare food differently? - Maybe. You need to be extra careful about avoiding germs in your food. Getting an infection while you are pregnant can cause serious problems.  Here's what you should do to avoid germs in your food: ?Wash your hands well with soap and water before you handle food.  ?Make sure to fully cook fish, chicken, beef, eggs, and other meats.  ?Rinse fresh fruits and vegetables under lots of running water before you eat  them. ?When you are done preparing food, wash your hands and anything that touched raw meat or deli meats with hot soapy water. This includes countertops, cutting boards, and knives and spoons. To reduce the risk of germs in food, you should also avoid foods that can easily carry germs, including:  ?Raw sprouts (including alfalfa, clover, radish, and mung bean) ?Milk, cheese, or juice that has not been pasteurized (also called unpasteurized) Which foods should I avoid? - You should avoid certain types of fish and all forms of alcohol. You should also limit the amount of caffeine in your diet, and check with your doctor before taking herbal products.  ?Fish - You should not eat types of fish that could have a lot of mercury in them. These include shark, swordfish, king mackerel, and tilefish. Mercury is a metal that can keep the baby's brain from developing normally.  You can eat types of fish that do not have a lot of mercury, but not more than 2 times a week. The types of fish and other seafood that are safe to eat 1 or 2 times a week include shrimp, canned light tuna, salmon, pollock, and catfish. Tuna steaks are also OK to eat, but you should have that only 1 time a week.   Check with your doctor or nurse about the safety of fish caught in local rivers and lakes.  ?Alcohol - You should avoid alcohol completely. Even small amounts of alcohol could harm a baby.  ?Caffeine - Limit the amount of caffeine in your diet by not drinking more than 1 or 2 cups of coffee each day. Tea and cola also have caffeine, but not as much as coffee. ?Herbal products - Check with your doctor or nurse before using herbal products. Some herbal teas might not be safe. What are prenatal vitamins? - Prenatal vitamins are vitamin supplements that you take the month before and all through your pregnancy. These vitamins, which also contain minerals (iron, calcium), help make sure that your baby has all the building blocks he or  she needs to form healthy organs. Prenatal vitamins help lower the risk of birth defects and other problems.  What should I look for in prenatal vitamins? - Choose a multivitamin that's labeled "prenatal" and that has at least 400 micrograms of folic acid. Folic ac id is especially important in preventing certain birth defects. Show your doctor or nurse the vitamins you plan to take to make sure the doses are right for you and your baby. Too much of some vitamins can be harmful. How much weight should I gain? - That will depend on how much you weigh to begin with. Your doctor or nurse will tell you how much weight gain is right for you. In general, a woman who is a healthy weight should gain 25 to 35 pounds during her pregnancy. A woman who is overweight or obese should gain less weight.

## 2013-04-25 NOTE — Progress Notes (Signed)
S: concerns include: weight gain: she states that in her previous pregnancy, she gained 50lbs and had trouble loosing after delivery. Diet includes: vegetables, tacos, some fruit, no juice or soda.  She otherwise denies any abdominal pain, vaginal bleeding. She has some light yellow, non odorous discharge which doesn't itch or burn.  O: see flowsheet A/P: 22 yo G2P1001 at 12.5wga by LMP who presents for prenatal visit.  - prenatal labs: reviewed. She will need varicella titer at 16 wga.  - weight gain: reviewed healthy diet in pregnancy including increasing portions of vegetables. Encouraged physical activity as long as it is not new from what she used to do before pregnancy. BMI of 27: weight gain of 15-25lbs throughout pregnancy.  - risk for GDM: patient is hispanic and BMI of 27 prior to pregnancy. No first degree relative with DM and no h/o GDm, but with h/o important weight gain in past pregnancy, there is low threshold to test. Plan on 1hr glucola at next visit - Quad screen: patient and her husband discussed this and opt not to do it.  - vaginal discharge: likely normal, asymptomatic. Advised patient to return if starts having symptoms from it.  - follow up in 3-4 weeks

## 2013-05-24 ENCOUNTER — Ambulatory Visit (INDEPENDENT_AMBULATORY_CARE_PROVIDER_SITE_OTHER): Payer: PRIVATE HEALTH INSURANCE | Admitting: Family Medicine

## 2013-05-24 DIAGNOSIS — J45909 Unspecified asthma, uncomplicated: Secondary | ICD-10-CM

## 2013-05-24 DIAGNOSIS — Z3480 Encounter for supervision of other normal pregnancy, unspecified trimester: Secondary | ICD-10-CM

## 2013-05-24 DIAGNOSIS — Z3482 Encounter for supervision of other normal pregnancy, second trimester: Secondary | ICD-10-CM

## 2013-05-24 LAB — GLUCOSE, CAPILLARY: Glucose-Capillary: 139 mg/dL — ABNORMAL HIGH (ref 70–99)

## 2013-05-24 MED ORDER — CETIRIZINE HCL 10 MG PO TABS: 10.0000 mg | ORAL_TABLET | Freq: Every day | ORAL | Status: DC

## 2013-05-24 MED ORDER — ALBUTEROL SULFATE HFA 108 (90 BASE) MCG/ACT IN AERS: 2.0000 | INHALATION_SPRAY | RESPIRATORY_TRACT | Status: DC | PRN

## 2013-05-24 NOTE — Patient Instructions (Signed)
Pregnancy - Second Trimester The second trimester of pregnancy (3 to 6 months) is a period of rapid growth for you and your baby. At the end of the sixth month, your baby is about 9 inches long and weighs 1 1/2 pounds. You will begin to feel the baby move between 18 and 20 weeks of the pregnancy. This is called quickening. Weight gain is faster. A clear fluid (colostrum) may leak out of your breasts. You may feel small contractions of the womb (uterus). This is known as false labor or Braxton-Hicks contractions. This is like a practice for labor when the baby is ready to be born. Usually, the problems with morning sickness have usually passed by the end of your first trimester. Some women develop small dark blotches (called cholasma, mask of pregnancy) on their face that usually goes away after the baby is born. Exposure to the sun makes the blotches worse. Acne may also develop in some pregnant women and pregnant women who have acne, may find that it goes away. PRENATAL EXAMS  Blood work may continue to be done during prenatal exams. These tests are done to check on your health and the probable health of your baby. Blood work is used to follow your blood levels (hemoglobin). Anemia (low hemoglobin) is common during pregnancy. Iron and vitamins are given to help prevent this. You will also be checked for diabetes between 24 and 28 weeks of the pregnancy. Some of the previous blood tests may be repeated.  The size of the uterus is measured during each visit. This is to make sure that the baby is continuing to grow properly according to the dates of the pregnancy.  Your blood pressure is checked every prenatal visit. This is to make sure you are not getting toxemia.  Your urine is checked to make sure you do not have an infection, diabetes or protein in the urine.  Your weight is checked often to make sure gains are happening at the suggested rate. This is to ensure that both you and your baby are  growing normally.  Sometimes, an ultrasound is performed to confirm the proper growth and development of the baby. This is a test which bounces harmless sound waves off the baby so your caregiver can more accurately determine due dates. Sometimes, a test is done on the amniotic fluid surrounding the baby. This test is called an amniocentesis. The amniotic fluid is obtained by sticking a needle into the belly (abdomen). This is done to check the chromosomes in instances where there is a concern about possible genetic problems with the baby. It is also sometimes done near the end of pregnancy if an early delivery is required. In this case, it is done to help make sure the baby's lungs are mature enough for the baby to live outside of the womb. CHANGES OCCURING IN THE SECOND TRIMESTER OF PREGNANCY Your body goes through many changes during pregnancy. They vary from person to person. Talk to your caregiver about changes you notice that you are concerned about.  During the second trimester, you will likely have an increase in your appetite. It is normal to have cravings for certain foods. This varies from person to person and pregnancy to pregnancy.  Your lower abdomen will begin to bulge.  You may have to urinate more often because the uterus and baby are pressing on your bladder. It is also common to get more bladder infections during pregnancy. You can help this by drinking lots of fluids   and emptying your bladder before and after intercourse.  You may begin to get stretch marks on your hips, abdomen, and breasts. These are normal changes in the body during pregnancy. There are no exercises or medicines to take that prevent this change.  You may begin to develop swollen and bulging veins (varicose veins) in your legs. Wearing support hose, elevating your feet for 15 minutes, 3 to 4 times a day and limiting salt in your diet helps lessen the problem.  Heartburn may develop as the uterus grows and  pushes up against the stomach. Antacids recommended by your caregiver helps with this problem. Also, eating smaller meals 4 to 5 times a day helps.  Constipation can be treated with a stool softener or adding bulk to your diet. Drinking lots of fluids, and eating vegetables, fruits, and whole grains are helpful.  Exercising is also helpful. If you have been very active up until your pregnancy, most of these activities can be continued during your pregnancy. If you have been less active, it is helpful to start an exercise program such as walking.  Hemorrhoids may develop at the end of the second trimester. Warm sitz baths and hemorrhoid cream recommended by your caregiver helps hemorrhoid problems.  Backaches may develop during this time of your pregnancy. Avoid heavy lifting, wear low heal shoes, and practice good posture to help with backache problems.  Some pregnant women develop tingling and numbness of their hand and fingers because of swelling and tightening of ligaments in the wrist (carpel tunnel syndrome). This goes away after the baby is born.  As your breasts enlarge, you may have to get a bigger bra. Get a comfortable, cotton, support bra. Do not get a nursing bra until the last month of the pregnancy if you will be nursing the baby.  You may get a dark line from your belly button to the pubic area called the linea nigra.  You may develop rosy cheeks because of increase blood flow to the face.  You may develop spider looking lines of the face, neck, arms, and chest. These go away after the baby is born. HOME CARE INSTRUCTIONS   It is extremely important to avoid all smoking, herbs, alcohol, and unprescribed drugs during your pregnancy. These chemicals affect the formation and growth of the baby. Avoid these chemicals throughout the pregnancy to ensure the delivery of a healthy infant.  Most of your home care instructions are the same as suggested for the first trimester of your  pregnancy. Keep your caregiver's appointments. Follow your caregiver's instructions regarding medicine use, exercise, and diet.  During pregnancy, you are providing food for you and your baby. Continue to eat regular, well-balanced meals. Choose foods such as meat, fish, milk and other low fat dairy products, vegetables, fruits, and whole-grain breads and cereals. Your caregiver will tell you of the ideal weight gain.  A physical sexual relationship may be continued up until near the end of pregnancy if there are no other problems. Problems could include early (premature) leaking of amniotic fluid from the membranes, vaginal bleeding, abdominal pain, or other medical or pregnancy problems.  Exercise regularly if there are no restrictions. Check with your caregiver if you are unsure of the safety of some of your exercises. The greatest weight gain will occur in the last 2 trimesters of pregnancy. Exercise will help you:  Control your weight.  Get you in shape for labor and delivery.  Lose weight after you have the baby.  Wear   a good support or jogging bra for breast tenderness during pregnancy. This may help if worn during sleep. Pads or tissues may be used in the bra if you are leaking colostrum.  Do not use hot tubs, steam rooms or saunas throughout the pregnancy.  Wear your seat belt at all times when driving. This protects you and your baby if you are in an accident.  Avoid raw meat, uncooked cheese, cat litter boxes, and soil used by cats. These carry germs that can cause birth defects in the baby.  The second trimester is also a good time to visit your dentist for your dental health if this has not been done yet. Getting your teeth cleaned is okay. Use a soft toothbrush. Brush gently during pregnancy.  It is easier to leak urine during pregnancy. Tightening up and strengthening the pelvic muscles will help with this problem. Practice stopping your urination while you are going to the  bathroom. These are the same muscles you need to strengthen. It is also the muscles you would use as if you were trying to stop from passing gas. You can practice tightening these muscles up 10 times a set and repeating this about 3 times per day. Once you know what muscles to tighten up, do not perform these exercises during urination. It is more likely to contribute to an infection by backing up the urine.  Ask for help if you have financial, counseling, or nutritional needs during pregnancy. Your caregiver will be able to offer counseling for these needs as well as refer you for other special needs.  Your skin may become oily. If so, wash your face with mild soap, use non-greasy moisturizer and oil or cream based makeup. MEDICINES AND DRUG USE IN PREGNANCY  Take prenatal vitamins as directed. The vitamin should contain 1 milligram of folic acid. Keep all vitamins out of reach of children. Only a couple vitamins or tablets containing iron may be fatal to a baby or young child when ingested.  Avoid use of all medicines, including herbs, over-the-counter medicines, not prescribed or suggested by your caregiver. Only take over-the-counter or prescription medicines for pain, discomfort, or fever as directed by your caregiver. Do not use aspirin.  Let your caregiver also know about herbs you may be using.  Alcohol is related to a number of birth defects. This includes fetal alcohol syndrome. All alcohol, in any form, should be avoided completely. Smoking will cause low birth rate and premature babies.  Street or illegal drugs are very harmful to the baby. They are absolutely forbidden. A baby born to an addicted mother will be addicted at birth. The baby will go through the same withdrawal an adult does. SEEK MEDICAL CARE IF:  You have any concerns or worries during your pregnancy. It is better to call with your questions if you feel they cannot wait, rather than worry about them. SEEK IMMEDIATE  MEDICAL CARE IF:   An unexplained oral temperature above 102 F (38.9 C) develops, or as your caregiver suggests.  You have leaking of fluid from the vagina (birth canal). If leaking membranes are suspected, take your temperature and tell your caregiver of this when you call.  There is vaginal spotting, bleeding, or passing clots. Tell your caregiver of the amount and how many pads are used. Light spotting in pregnancy is common, especially following intercourse.  You develop a bad smelling vaginal discharge with a change in the color from clear to white.  You continue to feel   sick to your stomach (nauseated) and have no relief from remedies suggested. You vomit blood or coffee ground-like materials.  You lose more than 2 pounds of weight or gain more than 2 pounds of weight over 1 week, or as suggested by your caregiver.  You notice swelling of your face, hands, feet, or legs.  You get exposed to German measles and have never had them.  You are exposed to fifth disease or chickenpox.  You develop belly (abdominal) pain. Round ligament discomfort is a common non-cancerous (benign) cause of abdominal pain in pregnancy. Your caregiver still must evaluate you.  You develop a bad headache that does not go away.  You develop fever, diarrhea, pain with urination, or shortness of breath.  You develop visual problems, blurry, or double vision.  You fall or are in a car accident or any kind of trauma.  There is mental or physical violence at home. Document Released: 07/19/2001 Document Revised: 04/18/2012 Document Reviewed: 01/21/2009 [employer] Patient Information 2014 [employer].  

## 2013-05-24 NOTE — Progress Notes (Signed)
Pt failed 1 hr glucose;  3 hr GTT scheduled Tues 05-28-13  BAJORDAN, MLS

## 2013-05-28 ENCOUNTER — Other Ambulatory Visit (INDEPENDENT_AMBULATORY_CARE_PROVIDER_SITE_OTHER): Payer: PRIVATE HEALTH INSURANCE

## 2013-05-28 DIAGNOSIS — Z331 Pregnant state, incidental: Secondary | ICD-10-CM

## 2013-05-28 LAB — GLUCOSE, CAPILLARY: Glucose-Capillary: 87 mg/dL (ref 70–99)

## 2013-05-28 NOTE — Progress Notes (Signed)
3 HR GTT DONE TODAY Shakeila Pfarr 

## 2013-05-29 LAB — GLUCOSE TOLERANCE, 3 HOURS: Glucose, GTT - 3 Hour: 85 mg/dL (ref 70–144)

## 2013-06-02 NOTE — Progress Notes (Signed)
Patient ID: Natalie Morgan    DOB: [birth year], 22 y.o.   MRN: [id number] --- Subjective:  Natalie Morgan is a 22 y.o.female who presents for routine prenatal visit.  Note: this encounter was on 05/24/13 but not charted due to template problems.   - patient notes that she has been having increased sneezing and some runny nose. She is concerned that this may be a precursor for asthma exacerbation. She ahs not taken any albuterol or other medication due to concern of safety during pregnancy. No shortness of breath, no fever, no chills. No cough.  Otherwise, no concerns.  ROS: see HPI Past Medical History: reviewed and updated medications and allergies. Social History: Tobacco: none  Objective:  Physical Examination:   Fetal heart rate: 140,  General appearance - alert, well appearing, and in no distress Lung - no wheezing, clear to auscultation bilaterally, normal work of breathing.    Assessment and Plan:  22 yo G2P1001 at 57.7 wga who presets for routine prenatal visit.  - asthma: intermittent, not having exacerbation. Albuterol as needed. Also start cetirizine for allergies.  - Anatomy US: obtained for 19-20wga gestational age - early glucola done today  - follow up in 4 weeks.   Marena Chancy, PGY-3 Family Medicine Resident

## 2013-06-03 ENCOUNTER — Ambulatory Visit (HOSPITAL_COMMUNITY)
Admission: RE | Admit: 2013-06-03 | Discharge: 2013-06-03 | Disposition: A | Discharge status: Home / Self Care | Payer: PRIVATE HEALTH INSURANCE | Source: Ambulatory Visit | Attending: Family Medicine | Admitting: Family Medicine

## 2013-06-03 ENCOUNTER — Ambulatory Visit (HOSPITAL_COMMUNITY): Admission: RE | Admit: 2013-06-03 | Payer: PRIVATE HEALTH INSURANCE | Source: Ambulatory Visit

## 2013-06-03 DIAGNOSIS — Z3689 Encounter for other specified antenatal screening: Secondary | ICD-10-CM | POA: Insufficient documentation

## 2013-06-03 DIAGNOSIS — Z3482 Encounter for supervision of other normal pregnancy, second trimester: Secondary | ICD-10-CM

## 2013-06-18 ENCOUNTER — Ambulatory Visit (INDEPENDENT_AMBULATORY_CARE_PROVIDER_SITE_OTHER): Payer: PRIVATE HEALTH INSURANCE | Admitting: Family Medicine

## 2013-06-18 VITALS — BP 105/68 | Temp 98.2°F | Wt 177.0 lb

## 2013-06-18 DIAGNOSIS — Z3480 Encounter for supervision of other normal pregnancy, unspecified trimester: Secondary | ICD-10-CM

## 2013-06-18 DIAGNOSIS — N898 Other specified noninflammatory disorders of vagina: Secondary | ICD-10-CM

## 2013-06-18 DIAGNOSIS — Z3482 Encounter for supervision of other normal pregnancy, second trimester: Secondary | ICD-10-CM

## 2013-06-18 LAB — POCT WET PREP (WET MOUNT)

## 2013-06-18 NOTE — Progress Notes (Signed)
S: headache 1 week ago. Similar to previous migraines. No change in vision. No swelling. Did not take anything for it. Has had a h/o migraines prior to pregnancy.  Vaginal discharge is present and more copious.  O: see flowsheet A/P: 22 yo G2P1001 at 20.4wga who presents for routine prenatal care.  - weight gain at goal - headache: appears to be migraine. With normal BP, no concern for pre-eclampsia but reviewed red flags with patient - vaginal discharge: obtain wet prep - OB US: repeat ultrasound - follow up in 4 weeks.

## 2013-06-18 NOTE — Patient Instructions (Signed)
For the headache, you can take tylenol 2 tablets every 6 hrs as needed for pain.

## 2013-06-19 ENCOUNTER — Telehealth: Payer: Self-pay | Admitting: Family Medicine

## 2013-06-19 NOTE — Telephone Encounter (Signed)
Please let patient know that her wet prep was normal.   Marena Chancy, PGY-3 Family Medicine Resident

## 2013-06-19 NOTE — Telephone Encounter (Signed)
Attempted to call patient.  Phone number does not have an identifiable voice mail, therefore I did not leave a message.  If patient calls, please give Md message below.  Amali Uhls, Darlyne Russian, CMA

## 2013-06-21 ENCOUNTER — Ambulatory Visit (HOSPITAL_COMMUNITY)
Admission: RE | Admit: 2013-06-21 | Discharge: 2013-06-21 | Disposition: A | Discharge status: Home / Self Care | Payer: PRIVATE HEALTH INSURANCE | Source: Ambulatory Visit | Attending: Family Medicine | Admitting: Family Medicine

## 2013-06-21 ENCOUNTER — Ambulatory Visit (HOSPITAL_COMMUNITY): Admission: RE | Admit: 2013-06-21 | Payer: PRIVATE HEALTH INSURANCE | Source: Ambulatory Visit

## 2013-06-21 DIAGNOSIS — Z3689 Encounter for other specified antenatal screening: Secondary | ICD-10-CM | POA: Insufficient documentation

## 2013-06-21 DIAGNOSIS — Z3482 Encounter for supervision of other normal pregnancy, second trimester: Secondary | ICD-10-CM

## 2013-06-23 ENCOUNTER — Encounter: Payer: Self-pay | Admitting: Family Medicine

## 2013-07-17 ENCOUNTER — Ambulatory Visit (INDEPENDENT_AMBULATORY_CARE_PROVIDER_SITE_OTHER): Payer: Self-pay | Admitting: Family Medicine

## 2013-07-17 VITALS — BP 106/66 | Wt 185.6 lb

## 2013-07-17 DIAGNOSIS — R21 Rash and other nonspecific skin eruption: Secondary | ICD-10-CM

## 2013-07-17 MED ORDER — TRIAMCINOLONE ACETONIDE 0.1 % EX OINT: 1.0000 "application " | TOPICAL_OINTMENT | Freq: Two times a day (BID) | CUTANEOUS | Status: DC

## 2013-07-17 NOTE — Patient Instructions (Signed)
If the rash gets worse, if he becomes more red, worsening itching, worsening pain, or warm to touch, return to clinic.  Followup with me in 3 weeks

## 2013-07-18 NOTE — Progress Notes (Signed)
S: reports a rash on her right leg that started 3 weeks ago. It started as a red spot on her lower leg that was itchy. A few days later she had another spot next to it. She denies any warmth, any drainage, any fevers or chills. She has been applying hydrocortisone cream on it with mild relief. She denies any new detergents, soaps. She has not been outside very much. She denies any camping, gardening, hiking. She does not have any pets. Nobody else in the house has a similar rash. Otherwise, she is feeling well. She denies any contractions, abdominal pain, vaginal bleeding or discharge. She reports good fetal movement. O: See flow sheet. Skin: 2 pink patches with irregular borders, blanchable, with associated excoriation and scabbing. No warmth. No scaly appearance A/P:  22 yo G2P1001 at 24.5wga who presents for routine prenatal follow up.  - skin rash: unclear etiology. Unlikely to be lyme disease given no exposure. May be from other insect bite. Doesn't appear to be scabies either. Doesn't appear to be scaly rash and so less likely to be tinea. No evidence of cellulitis since no warmth or fevers associated with it. Will treat with triamcinolone ointment. If not better in a few days or worst, patient to return to care.  - ultrasound was repeated on 06/22/13 but was unable to fully visualize cardiac anatomy. Aortic arch and ductal arch were not visualized. Ventricles appeared normal. Will hold on repeat US for now. Re-evaluate at next visit.  - weight gain: discussed again weight control. Fundal height within limits but will reassess at next visit.  - follow up in 3 weeks for glucola, CBC, HIV, RPR, Tdap

## 2013-08-08 NOTE — L&D Delivery Note (Signed)
Delivery Note At 2:59 PM a viable female was delivered via  (Presentation: ROA).  APGAR:8, 9 ; weight TBD.   Placenta status: intact with 3 vessel cord.  with the following complications: none. No shoulder complication. Hemostasis achieved with fundal massage.   Anesthesia: none, fentanyl  Episiotomy: none Lacerations: none Suture Repair: none Est. Blood Loss (mL): 500 ml  Mom to postpartum.  Baby to Couplet care / Skin to Skin.  Marena ChancyLOSQ, STEPHANIE 11/01/2013, 3:28 PM  I have seen and examined this patient and agree with above documentation in the resident's note. Pt pushed with good maternal effort and no epidural to deliver a liveborn female with spontaneous cry via NSVD over intact perineum. Baby to skin to skin. Cord cut and clamped. Placenta delivered spontaneously intact with 3V cord.  No complications. No tears. Mom to postpartum and baby to skin to skin.   Rulon AbideKeli Rozell Theiler, M.D. [facility] 11/01/2013 3:48 PM

## 2013-08-12 ENCOUNTER — Ambulatory Visit (INDEPENDENT_AMBULATORY_CARE_PROVIDER_SITE_OTHER): Payer: Self-pay | Admitting: Family Medicine

## 2013-08-12 VITALS — BP 114/75 | Wt 192.3 lb

## 2013-08-12 DIAGNOSIS — Z3480 Encounter for supervision of other normal pregnancy, unspecified trimester: Secondary | ICD-10-CM

## 2013-08-12 DIAGNOSIS — Z3482 Encounter for supervision of other normal pregnancy, second trimester: Secondary | ICD-10-CM

## 2013-08-12 LAB — CBC
HCT: 32.3 % — ABNORMAL LOW (ref 36.0–46.0)
Hemoglobin: 10.8 g/dL — ABNORMAL LOW (ref 12.0–15.0)
MCH: 28.2 pg (ref 26.0–34.0)
MCHC: 33.4 g/dL (ref 30.0–36.0)
MCV: 84.3 fL (ref 78.0–100.0)
Platelets: 218 10*3/uL (ref 150–400)
RBC: 3.83 MIL/uL — AB (ref 3.87–5.11)
RDW: 14.3 % (ref 11.5–15.5)
WBC: 8.2 10*3/uL (ref 4.0–10.5)

## 2013-08-12 LAB — GLUCOSE, CAPILLARY
Comment 1: 1
GLUCOSE-CAPILLARY: 144 mg/dL — AB (ref 70–99)

## 2013-08-12 MED ORDER — PRENATAL PLUS 27-1 MG PO TABS: 1.0000 | ORAL_TABLET | Freq: Every day | ORAL | Status: DC

## 2013-08-12 NOTE — Progress Notes (Signed)
S: routine prenatal visit. Overall doing well. Had diarrhea last week for 3 days of multiple episodes of watery diarrhea. She called the [facility] and was advised to take immodium which she did not take due to her symptos improving. Since then, she has been eating and drinking well and has not had recurrence of diarrhea.  She denies any vaginal bleeding, discharge, loss of fluid. SHe reports tightening of her abdomen 2-3 times per day, lasting a few secs at a time.  Diet wise, she admits to eating more sweet and fatty foods during the christmas holiday.  O: see flowsheet A/P: 23 yo G2P1001 at 28.3wga who presents for routine prenatal visit.  - diarrhea: likely gastroenteritis: resolved. COntinue fluids - weight gain: gaining more weight than recommended. Discussed options for reducing calories. Patient would like to try to going back to drinking vegetable drinks she used. to make for herself before her pregnancy. Info for nutritionist given.  - check glucola. Had early glucola done which she failed and was followed by 3hr glucola which she passed. Repeat today - check CBC. She declined HIV and RPR test. HIV and RPR negative at beginning of pregnancy.  - follow up in 2 weeks.

## 2013-08-12 NOTE — Patient Instructions (Signed)
I recommend that you meet with our Registered Dietitian (Nutritionist), Dr. Vickki MuffJeanne Sykes for help addressing your dietary and eating habits.  You can schedule an appointment with her by calling her directly at [phone].   Please follow up in 2 weeks.   If the belly tightening occurs more than 4-5 times per hour or you have leaking of fluid, please go straight to the [facility].

## 2013-08-20 ENCOUNTER — Other Ambulatory Visit (INDEPENDENT_AMBULATORY_CARE_PROVIDER_SITE_OTHER): Payer: Self-pay

## 2013-08-20 DIAGNOSIS — Z331 Pregnant state, incidental: Secondary | ICD-10-CM

## 2013-08-20 LAB — GLUCOSE, CAPILLARY: Glucose-Capillary: 97 mg/dL (ref 70–99)

## 2013-08-20 NOTE — Progress Notes (Signed)
3 HR GTT DONE TODAY Tomas Schamp 

## 2013-08-21 LAB — GLUCOSE TOLERANCE, 3 HOURS
GLUCOSE, 2 HOUR-GESTATIONAL: 136 mg/dL (ref 70–164)
GLUCOSE, FASTING-GESTATIONAL: 97 mg/dL (ref 70–104)
Glucose Tolerance, 1 hour: 159 mg/dL (ref 70–189)
Glucose, GTT - 3 Hour: 113 mg/dL (ref 70–144)

## 2013-08-27 ENCOUNTER — Ambulatory Visit (INDEPENDENT_AMBULATORY_CARE_PROVIDER_SITE_OTHER): Payer: Self-pay | Admitting: Family Medicine

## 2013-08-27 VITALS — BP 129/73 | Wt 194.0 lb

## 2013-08-27 DIAGNOSIS — Z3480 Encounter for supervision of other normal pregnancy, unspecified trimester: Secondary | ICD-10-CM

## 2013-08-27 NOTE — Progress Notes (Signed)
S: reports feeling well. She reports good fetal movement. She had an episode of thin clear discharge on Sunday. This had happened a few times before, scattered throughout pregnancy. She denies any vaginal bleeding. No itching or pain. Feels abdomen contraction once a week.  O: see flowsheet A/P:  23 yo G2P1001 at 30.4wga by LMP who presents for routine OB visit.  - reviewed CBC showing mild anemia with Hg: 10.8. Continue to monitor closer to delivery. Hg>10 and will therefore not treat with iron.  - 3hr glucola normal - weight gain: 1lb per week. Patient measuring within limits, but continue to monitor closely - thin discharge: not currently having any leaking but recommended that if she has anymore occurrence of this [facility] membrane [facility]. Fetal heart tones and measurements are reassuring today.  - TdaP: recommended vaccination but patient hesitant due to cost - follow up in 2 weeks with OB clinic

## 2013-08-27 NOTE — Patient Instructions (Signed)
Please make an appointment with the [facility] clinic here at [facility].  If you have anymore leaking of fluid, go directly to the MAU at [facility] for evaluation.   Third Trimester of Pregnancy The third trimester is from week 29 through week 42, months 7 through 9. The third trimester is a time when the fetus is growing rapidly. At the end of the ninth month, the fetus is about 20 inches in length and weighs 6 10 pounds.  BODY CHANGES Your body goes through many changes during pregnancy. The changes vary from woman to woman.   Your weight will continue to increase. You can expect to gain 25 35 pounds (11 16 kg) by the end of the pregnancy.  You may begin to get stretch marks on your hips, abdomen, and breasts.  You may urinate more often because the fetus is moving lower into your pelvis and pressing on your bladder.  You may develop or continue to have heartburn as a result of your pregnancy.  You may develop constipation because certain hormones are causing the muscles that push waste through your intestines to slow down.  You may develop hemorrhoids or swollen, bulging veins (varicose veins).  You may have pelvic pain because of the weight gain and pregnancy hormones relaxing your joints between the bones in your pelvis. Back aches may result from over exertion of the muscles supporting your posture.  Your breasts will continue to grow and be tender. A yellow discharge may leak from your breasts called colostrum.  Your belly button may stick out.  You may feel short of breath because of your expanding uterus.  You may notice the fetus "dropping," or moving lower in your abdomen.  You may have a bloody mucus discharge. This usually occurs a few days to a week before labor begins.  Your cervix becomes thin and soft (effaced) near your due date. WHAT TO EXPECT AT YOUR PRENATAL EXAMS  You will have prenatal exams every 2 weeks until week 36. Then, you will have weekly prenatal  exams. During a routine prenatal visit:  You will be weighed to make sure you and the fetus are growing normally.  Your blood pressure is taken.  Your abdomen will be measured to track your baby's growth.  The fetal heartbeat will be listened to.  Any test results from the previous visit will be discussed.  You may have a cervical check near your due date to see if you have effaced. At around 36 weeks, your caregiver will check your cervix. At the same time, your caregiver will also perform a test on the secretions of the vaginal tissue. This test is to determine if a type of bacteria, Group B streptococcus, is present. Your caregiver will explain this further. Your caregiver may ask you:  What your birth plan is.  How you are feeling.  If you are feeling the baby move.  If you have had any abnormal symptoms, such as leaking fluid, bleeding, severe headaches, or abdominal cramping.  If you have any questions. Other tests or screenings that may be performed during your third trimester include:  Blood tests that check for low iron levels (anemia).  Fetal testing to check the health, activity level, and growth of the fetus. Testing is done if you have certain medical conditions or if there are problems during the pregnancy. FALSE LABOR You may feel small, irregular contractions that eventually go away. These are called Braxton Hicks contractions, or false labor. Contractions may  last for hours, days, or even weeks before true labor sets in. If contractions come at regular intervals, intensify, or become painful, it is best to be seen by your caregiver.  SIGNS OF LABOR   Menstrual-like cramps.  Contractions that are 5 minutes apart or less.  Contractions that start on the top of the uterus and spread down to the lower abdomen and back.  A sense of increased pelvic pressure or back pain.  A watery or bloody mucus discharge that comes from the vagina. If you have any of these  signs before the 37th week of pregnancy, call your caregiver right away. You need to go to the hospital to get checked immediately. HOME CARE INSTRUCTIONS   Avoid all smoking, herbs, alcohol, and unprescribed drugs. These chemicals affect the formation and growth of the baby.  Follow your caregiver's instructions regarding medicine use. There are medicines that are either safe or unsafe to take during pregnancy.  Exercise only as directed by your caregiver. Experiencing uterine cramps is a good sign to stop exercising.  Continue to eat regular, healthy meals.  Wear a good support bra for breast tenderness.  Do not use hot tubs, steam rooms, or saunas.  Wear your seat belt at all times when driving.  Avoid raw meat, uncooked cheese, cat litter boxes, and soil used by cats. These carry germs that can cause birth defects in the baby.  Take your prenatal vitamins.  Try taking a stool softener (if your caregiver approves) if you develop constipation. Eat more high-fiber foods, such as fresh vegetables or fruit and whole grains. Drink plenty of fluids to keep your urine clear or pale yellow.  Take warm sitz baths to soothe any pain or discomfort caused by hemorrhoids. Use hemorrhoid cream if your caregiver approves.  If you develop varicose veins, wear support hose. Elevate your feet for 15 minutes, 3 4 times a day. Limit salt in your diet.  Avoid heavy lifting, wear low heal shoes, and practice good posture.  Rest a lot with your legs elevated if you have leg cramps or low back pain.  Visit your dentist if you have not gone during your pregnancy. Use a soft toothbrush to brush your teeth and be gentle when you floss.  A sexual relationship may be continued unless your caregiver directs you otherwise.  Do not travel far distances unless it is absolutely necessary and only with the approval of your caregiver.  Take prenatal classes to understand, practice, and ask questions about the  labor and delivery.  Make a trial run to the hospital.  Pack your hospital bag.  Prepare the baby's nursery.  Continue to go to all your prenatal visits as directed by your caregiver. SEEK MEDICAL CARE IF:  You are unsure if you are in labor or if your water has broken.  You have dizziness.  You have mild pelvic cramps, pelvic pressure, or nagging pain in your abdominal area.  You have persistent nausea, vomiting, or diarrhea.  You have a bad smelling vaginal discharge.  You have pain with urination. SEEK IMMEDIATE MEDICAL CARE IF:   You have a fever.  You are leaking fluid from your vagina.  You have spotting or bleeding from your vagina.  You have severe abdominal cramping or pain.  You have rapid weight loss or gain.  You have shortness of breath with chest pain.  You notice sudden or extreme swelling of your face, hands, ankles, feet, or legs.  You have not felt  your baby move in over an hour.  You have severe headaches that do not go away with medicine.  You have vision changes. Document Released: 07/19/2001 Document Revised: 03/27/2013 Document Reviewed: 09/25/2012 [facility] Patient Information 2014 [place], [place].

## 2013-09-03 ENCOUNTER — Ambulatory Visit (INDEPENDENT_AMBULATORY_CARE_PROVIDER_SITE_OTHER): Payer: Medicaid Other | Admitting: Family Medicine

## 2013-09-03 VITALS — BP 103/70 | Temp 98.3°F | Wt 197.0 lb

## 2013-09-03 DIAGNOSIS — J45909 Unspecified asthma, uncomplicated: Secondary | ICD-10-CM

## 2013-09-03 DIAGNOSIS — J069 Acute upper respiratory infection, unspecified: Secondary | ICD-10-CM

## 2013-09-03 MED ORDER — ALBUTEROL SULFATE HFA 108 (90 BASE) MCG/ACT IN AERS: 2.0000 | INHALATION_SPRAY | Freq: Four times a day (QID) | RESPIRATORY_TRACT | Status: DC | PRN

## 2013-09-03 NOTE — Patient Instructions (Signed)
It was nice seeing you Natalie Morgan,but am sorry you feel sick,this is likely viral infection, even though you did not get your flu shot, I do not believe you have influenza since your temperature is normal. Viral respiratory infection typically last about 7 days and then goes away, if your symptom persist or worsens please give us a call. Use Tylenol as needed for pain and headache. We will see you at follow up next Thursday as scheduled.

## 2013-09-03 NOTE — Progress Notes (Signed)
22 Y  G2P1001 at 3667w4d GA here for cough and congestion that started 3 days ago. There is associated nasal congestion and sputum production with her cough, cough is worse at night associated with mild wheezing and SOB,she denies chest tightness but has chest congestion. Also complaints of left ear ache ,no ear discharge,no hearing loss.She had used albuterol in the past for her Asthma. Patient denies any sick contact, in Oct her husband had URI otherwise they have all been well. She denies any pregnancy related complaints today. Patient had not used any medication for pain or fever.  Current Outpatient Prescriptions on File Prior to Visit  Medication Sig Dispense Refill  . cetirizine (ZYRTEC) 10 MG tablet Take 1 tablet (10 mg total) by mouth daily.  30 tablet  11  . prenatal vitamin w/FE, FA (PRENATAL 1 + 1) 27-1 MG TABS tablet Take 1 tablet by mouth daily at 12 noon.  100 each  2  . triamcinolone ointment (KENALOG) 0.1 % Apply 1 application topically 2 (two) times daily.  30 g  0   No current facility-administered medications on file prior to visit.   Past Medical History  Diagnosis Date  . Recurrent upper respiratory infection (URI)   . Asthma     no meds    Filed Vitals:   09/03/13 1121  BP: 103/70  Temp: 98.3 F (36.8 C)  Weight: 197 lb (89.359 kg)  O2 Sat on RA 97%  Phy Exam: Gen: Not in distress. Neuro: awake and alert,oriented x 3. Resp: Air entry equal B/L and clear,no wheezing,no rales or crepitations. CV: S1 S2 normal,no murmurs, RRR. Abd: Gravid, FH 32 cm, FHR 154, non-tender abdomen. Ext; No edema.  A/P; 23 Y/O female at 6067w4d GA with URI.        Temp normal with normal O2 Sat today, even though she did not get flu shot, I am not convinced she has influenza,no sick contact and she is afebrile.        Symptomatic treatment recommended with Tylenol as needed for pain.        I refilled her Albuterol for Asthma to use for wheezing/SOB. Currently lung exam completely  benign.        Instruction was given for patient to call if not better in 2 days or symptom worsen,then would consider treatment with Tamiflu.        Continue prenatal vitamin and routine prenatal care.        [facility].        Patient verbalized understanding of instruction given.

## 2013-09-06 ENCOUNTER — Ambulatory Visit: Payer: No Typology Code available for payment source

## 2013-09-12 ENCOUNTER — Ambulatory Visit (INDEPENDENT_AMBULATORY_CARE_PROVIDER_SITE_OTHER): Payer: PRIVATE HEALTH INSURANCE | Admitting: Family Medicine

## 2013-09-12 VITALS — BP 104/68 | Temp 98.3°F | Wt 198.7 lb

## 2013-09-12 DIAGNOSIS — Z23 Encounter for immunization: Secondary | ICD-10-CM

## 2013-09-12 DIAGNOSIS — H6691 Otitis media, unspecified, right ear: Secondary | ICD-10-CM | POA: Insufficient documentation

## 2013-09-12 DIAGNOSIS — H6690 Otitis media, unspecified, unspecified ear: Secondary | ICD-10-CM

## 2013-09-12 MED ORDER — AMOXICILLIN 500 MG PO CAPS: 500.0000 mg | ORAL_CAPSULE | Freq: Two times a day (BID) | ORAL | Status: DC

## 2013-09-12 NOTE — Progress Notes (Signed)
[facility] Patient seen today in OB clinic at 6164w6d EGA.  She complains of right ear pain that has been present for about 1-1/2 weeks, initially presented with fever and left ear pain. The left ear is better but now with pain and decreased hearing from the right ear. No recent fevers.  Some mild cough. She has a history of mild asthma (triggers cold air and URIs) but has not required her albuterol HFA in the past few weeks.  No shortness of breath.  No sick contacts.  No secondhand smoke contact.  Feels daily FM, no LOF or bleeding/discharge. No contractions or pelvic pain. Plans to breastfeed.   Discussed weight gain of 29# during this pregnancy.  Passed 3hrGTT twice during this pregnancy.  She is limiting the number of tortillas she eats; eats fresh fruits to snack. Discussed s/sx labor.  Recommendation for influenza vaccine, patient accepts.  Exam:  Well appearing, no apparent distress HEENT Neck supple without adenopathy. L TM clear with good cone of light.  R TM with diffuse erythema and some bulging. No rupture> R EAC is clear.  PULM Clear, no wheezes or rales.  Rest of exam per Flowsheet. Vertex by Le[place]olds today.  A/P: R OM, treat with Amoxil x 10 days, follow up in 1-2 weeks for routine prenatal visit and to recheck R ear/be sure of clinical improvement.  Paula ComptonJames Adi Seales, MD

## 2013-09-12 NOTE — Patient Instructions (Signed)
[facility] was a [facility].  You are 32 weeks and 6 days along in your pregnancy.   For your RIGHT EAR INFECTION, I sent a prescription for Amoxicillin 500mg  tablets, take 1 tablet by mouth twice daily for 10 days.  [facility] [address].   Flu vaccine given today.  I would like you to follow up with Dr Gwenlyn SaranLosq in 1 week for OB visit, and to be sure your ear is getting better.   Preterm Labor Information Preterm labor is when labor starts at less than 37 weeks of pregnancy. The normal length of a pregnancy is 39 to 41 weeks. CAUSES Often, there is no identifiable underlying cause as to why a woman goes into preterm labor. One of the most common known causes of preterm labor is infection. Infections of the uterus, cervix, vagina, amniotic sac, bladder, kidney, or even the lungs (pneumonia) can cause labor to start. Other suspected causes of preterm labor include:   Urogenital infections, such as yeast infections and bacterial vaginosis.   Uterine abnormalities (uterine shape, uterine septum, fibroids, or bleeding from the placenta).   A cervix that has been operated on (it may fail to stay closed).   Malformations in the fetus.   Multiple gestations (twins, triplets, and so on).   Breakage of the amniotic sac.  RISK FACTORS  Having a previous history of preterm labor.   Having premature rupture of membranes (PROM).   Having a placenta that covers the opening of the cervix (placenta previa).   Having a placenta that separates from the uterus (placental abruption).   Having a cervix that is too weak to hold the fetus in the uterus (incompetent cervix).   Having too much fluid in the amniotic sac (polyhydramnios).   Taking illegal drugs or smoking while pregnant.   Not gaining enough weight while pregnant.   Being younger than 8918 and older than 22 years old.   Having a low socioeconomic status.   Being African  American. SYMPTOMS Signs and symptoms of preterm labor include:   Menstrual-like cramps, abdominal pain, or back pain.  Uterine contractions that are regular, as frequent as six in an hour, regardless of their intensity (may be mild or painful).  Contractions that start on the top of the uterus and spread down to the lower abdomen and back.   A sense of increased pelvic pressure.   A watery or bloody mucus discharge that comes from the vagina.  TREATMENT Depending on the length of the pregnancy and other circumstances, your health care provider may suggest bed rest. If necessary, there are medicines that can be given to stop contractions and to mature the fetal lungs. If labor happens before 34 weeks of pregnancy, a prolonged hospital stay may be recommended. Treatment depends on the condition of both you and the fetus.  WHAT SHOULD YOU DO IF YOU THINK YOU ARE IN PRETERM LABOR? Call your health care provider right away. You will need to go to the hospital to get checked immediately. HOW CAN YOU PREVENT PRETERM LABOR IN FUTURE PREGNANCIES? You should:   Stop smoking if you smoke.  Maintain healthy weight gain and avoid chemicals and drugs that are not necessary.  Be watchful for any type of infection.  Inform your health care provider if you have a known history of preterm labor. Document Released: 10/15/2003 Document Revised: 03/27/2013 Document Reviewed: 08/27/2012 [facility] Patient Information 2014 [place], [place].

## 2013-09-18 ENCOUNTER — Ambulatory Visit: Payer: PRIVATE HEALTH INSURANCE | Admitting: Family Medicine

## 2013-10-01 ENCOUNTER — Encounter: Payer: PRIVATE HEALTH INSURANCE | Admitting: Family Medicine

## 2013-10-04 ENCOUNTER — Ambulatory Visit (INDEPENDENT_AMBULATORY_CARE_PROVIDER_SITE_OTHER): Payer: PRIVATE HEALTH INSURANCE | Admitting: Family Medicine

## 2013-10-04 ENCOUNTER — Other Ambulatory Visit (HOSPITAL_COMMUNITY)
Admission: RE | Admit: 2013-10-04 | Discharge: 2013-10-04 | Disposition: A | Discharge status: Home / Self Care | Payer: PRIVATE HEALTH INSURANCE | Source: Ambulatory Visit | Attending: Family Medicine | Admitting: Family Medicine

## 2013-10-04 VITALS — BP 108/66 | Temp 98.2°F | Wt 206.0 lb

## 2013-10-04 DIAGNOSIS — Z3480 Encounter for supervision of other normal pregnancy, unspecified trimester: Secondary | ICD-10-CM

## 2013-10-04 DIAGNOSIS — Z113 Encounter for screening for infections with a predominantly sexual mode of transmission: Secondary | ICD-10-CM | POA: Insufficient documentation

## 2013-10-04 NOTE — Patient Instructions (Signed)
Follow up in 1 week.  Everything looks good!  Fetal Movement Counts Patient Name: __________________________________________________ Patient Due Date: ____________________ Performing a fetal movement count is highly recommended in high-risk pregnancies, but it is good for every pregnant woman to do. Your caregiver may ask you to start counting fetal movements at 28 weeks of the pregnancy. Fetal movements often increase:  After eating a full meal.  After physical activity.  After eating or drinking something sweet or cold.  At rest. Pay attention to when you feel the baby is most active. This will help you notice a pattern of your baby's sleep and wake cycles and what factors contribute to an increase in fetal movement. It is important to perform a fetal movement count at the same time each day when your baby is normally most active.  HOW TO COUNT FETAL MOVEMENTS 1. Find a quiet and comfortable area to sit or lie down on your left side. Lying on your left side provides the best blood and oxygen circulation to your baby. 2. Write down the day and time on a sheet of paper or in a journal. 3. Start counting kicks, flutters, swishes, rolls, or jabs in a 2 hour period. You should feel at least 10 movements within 2 hours. 4. If you do not feel 10 movements in 2 hours, wait 2 3 hours and count again. Look for a change in the pattern or not enough counts in 2 hours. SEEK MEDICAL CARE IF:  You feel less than 10 counts in 2 hours, tried twice.  There is no movement in over an hour.  The pattern is changing or taking longer each day to reach 10 counts in 2 hours.  You feel the baby is not moving as he or she usually does.

## 2013-10-04 NOTE — Progress Notes (Signed)
S: doing well. No complaints. Left ear back to normal after course of antibiotics.  No vaginal bleeding, vaginal discharge. No loss of fluid, feeling contractions 3-4 times per day. Feeling fetal mvt.  O: see flowsheet HEENT: left TM normal appearing without effusion or bulging membrane, normal right TM A/P:  23 yo G2P1001 at 36wga by LMP who presents for routine OB visit.  - GBS, GC/Chl done today - weight gain: total of 36lb weight gain since beginning of pregnancy. Patient's 3hr glucola passed. Measurement at 37cm and consistent with dates. Continue to monitor.  - fetal kick counts and labor precautions reviewed.   - follow up in 1 week.

## 2013-10-06 LAB — STREP B DNA PROBE: GBSP: NEGATIVE

## 2013-10-07 LAB — CERVICOVAGINAL ANCILLARY ONLY
CHLAMYDIA, DNA PROBE: NEGATIVE
Neisseria Gonorrhea: NEGATIVE

## 2013-10-14 ENCOUNTER — Encounter: Payer: Self-pay | Admitting: Family Medicine

## 2013-10-14 ENCOUNTER — Ambulatory Visit (INDEPENDENT_AMBULATORY_CARE_PROVIDER_SITE_OTHER): Payer: PRIVATE HEALTH INSURANCE | Admitting: Family Medicine

## 2013-10-14 VITALS — BP 104/71 | Temp 98.6°F | Wt 211.0 lb

## 2013-10-14 DIAGNOSIS — Z3480 Encounter for supervision of other normal pregnancy, unspecified trimester: Secondary | ICD-10-CM

## 2013-10-14 NOTE — Patient Instructions (Signed)
Follow up in 1 week  Third Trimester of Pregnancy The third trimester is from week 29 through week 42, months 7 through 9. The third trimester is a time when the fetus is growing rapidly. At the end of the ninth month, the fetus is about 20 inches in length and weighs 6 10 pounds.  BODY CHANGES Your body goes through many changes during pregnancy. The changes vary from woman to woman.   Your weight will continue to increase. You can expect to gain 25 35 pounds (11 16 kg) by the end of the pregnancy.  You may begin to get stretch marks on your hips, abdomen, and breasts.  You may urinate more often because the fetus is moving lower into your pelvis and pressing on your bladder.  You may develop or continue to have heartburn as a result of your pregnancy.  You may develop constipation because certain hormones are causing the muscles that push waste through your intestines to slow down.  You may develop hemorrhoids or swollen, bulging veins (varicose veins).  You may have pelvic pain because of the weight gain and pregnancy hormones relaxing your joints between the bones in your pelvis. Back aches may result from over exertion of the muscles supporting your posture.  Your breasts will continue to grow and be tender. A yellow discharge may leak from your breasts called colostrum.  Your belly button may stick out.  You may feel short of breath because of your expanding uterus.  You may notice the fetus "dropping," or moving lower in your abdomen.  You may have a bloody mucus discharge. This usually occurs a few days to a week before labor begins.  Your cervix becomes thin and soft (effaced) near your due date. WHAT TO EXPECT AT YOUR PRENATAL EXAMS  You will have prenatal exams every 2 weeks until week 36. Then, you will have weekly prenatal exams. During a routine prenatal visit:  You will be weighed to make sure you and the fetus are growing normally.  Your blood pressure is  taken.  Your abdomen will be measured to track your baby's growth.  The fetal heartbeat will be listened to.  Any test results from the previous visit will be discussed.  You may have a cervical check near your due date to see if you have effaced. At around 36 weeks, your caregiver will check your cervix. At the same time, your caregiver will also perform a test on the secretions of the vaginal tissue. This test is to determine if a type of bacteria, Group B streptococcus, is present. Your caregiver will explain this further. Your caregiver may ask you:  What your birth plan is.  How you are feeling.  If you are feeling the baby move.  If you have had any abnormal symptoms, such as leaking fluid, bleeding, severe headaches, or abdominal cramping.  If you have any questions. Other tests or screenings that may be performed during your third trimester include:  Blood tests that check for low iron levels (anemia).  Fetal testing to check the health, activity level, and growth of the fetus. Testing is done if you have certain medical conditions or if there are problems during the pregnancy. FALSE LABOR You may feel small, irregular contractions that eventually go away. These are called Braxton Hicks contractions, or false labor. Contractions may last for hours, days, or even weeks before true labor sets in. If contractions come at regular intervals, intensify, or become painful, it is best to   be seen by your caregiver.  SIGNS OF LABOR   Menstrual-like cramps.  Contractions that are 5 minutes apart or less.  Contractions that start on the top of the uterus and spread down to the lower abdomen and back.  A sense of increased pelvic pressure or back pain.  A watery or bloody mucus discharge that comes from the vagina. If you have any of these signs before the 37th week of pregnancy, call your caregiver right away. You need to go to the hospital to get checked immediately. HOME CARE  INSTRUCTIONS   Avoid all smoking, herbs, alcohol, and unprescribed drugs. These chemicals affect the formation and growth of the baby.  Follow your caregiver's instructions regarding medicine use. There are medicines that are either safe or unsafe to take during pregnancy.  Exercise only as directed by your caregiver. Experiencing uterine cramps is a good sign to stop exercising.  Continue to eat regular, healthy meals.  Wear a good support bra for breast tenderness.  Do not use hot tubs, steam rooms, or saunas.  Wear your seat belt at all times when driving.  Avoid raw meat, uncooked cheese, cat litter boxes, and soil used by cats. These carry germs that can cause birth defects in the baby.  Take your prenatal vitamins.  Try taking a stool softener (if your caregiver approves) if you develop constipation. Eat more high-fiber foods, such as fresh vegetables or fruit and whole grains. Drink plenty of fluids to keep your urine clear or pale yellow.  Take warm sitz baths to soothe any pain or discomfort caused by hemorrhoids. Use hemorrhoid cream if your caregiver approves.  If you develop varicose veins, wear support hose. Elevate your feet for 15 minutes, 3 4 times a day. Limit salt in your diet.  Avoid heavy lifting, wear low heal shoes, and practice good posture.  Rest a lot with your legs elevated if you have leg cramps or low back pain.  Visit your dentist if you have not gone during your pregnancy. Use a soft toothbrush to brush your teeth and be gentle when you floss.  A sexual relationship may be continued unless your caregiver directs you otherwise.  Do not travel far distances unless it is absolutely necessary and only with the approval of your caregiver.  Take prenatal classes to understand, practice, and ask questions about the labor and delivery.  Make a trial run to the hospital.  Pack your hospital bag.  Prepare the baby's nursery.  Continue to go to all your  prenatal visits as directed by your caregiver. SEEK MEDICAL CARE IF:  You are unsure if you are in labor or if your water has broken.  You have dizziness.  You have mild pelvic cramps, pelvic pressure, or nagging pain in your abdominal area.  You have persistent nausea, vomiting, or diarrhea.  You have a bad smelling vaginal discharge.  You have pain with urination. SEEK IMMEDIATE MEDICAL CARE IF:   You have a fever.  You are leaking fluid from your vagina.  You have spotting or bleeding from your vagina.  You have severe abdominal cramping or pain.  You have rapid weight loss or gain.  You have shortness of breath with chest pain.  You notice sudden or extreme swelling of your face, hands, ankles, feet, or legs.  You have not felt your baby move in over an hour.  You have severe headaches that do not go away with medicine.  You have vision changes. Document Released:   07/19/2001 Document Revised: 03/27/2013 Document Reviewed: 09/25/2012 [employer] Patient Information 2014 [employer].  

## 2013-10-15 NOTE — Progress Notes (Signed)
S: doing well. Some lower extremity swelling with walking. No vaginal bleeding. No loss of fluid. Feeling contractions every 3-4 hrs. Good fetal movement.  O: see flowsheet A/P 23 yo G2P1001 at 637.3 wga who presents for routine OB appointment.  - weight gain: reviewed weight gain of 5lbs in 1.3 weeks. Fundal measurement is within 2cm of gestational age which is reassuring. WIll continue to monitor fetal weight. Also discussed reducing sweet drinks and foods and exercising daily.  - reviewed negative GBS, GC/Chl with patient - follow up in 1 week

## 2013-10-23 ENCOUNTER — Telehealth: Payer: Self-pay | Admitting: *Deleted

## 2013-10-23 ENCOUNTER — Ambulatory Visit (INDEPENDENT_AMBULATORY_CARE_PROVIDER_SITE_OTHER): Payer: PRIVATE HEALTH INSURANCE | Admitting: Family Medicine

## 2013-10-23 VITALS — BP 105/70 | Temp 98.6°F | Wt 213.0 lb

## 2013-10-23 DIAGNOSIS — Z3480 Encounter for supervision of other normal pregnancy, unspecified trimester: Secondary | ICD-10-CM

## 2013-10-23 LAB — POCT WET PREP (WET MOUNT): CLUE CELLS WET PREP WHIFF POC: NEGATIVE

## 2013-10-23 LAB — POCT FERNING: FERNING, POC: NEGATIVE

## 2013-10-23 NOTE — Patient Instructions (Signed)
We need to get an ultrasound to assess the fluid and weight of the baby.

## 2013-10-23 NOTE — Telephone Encounter (Signed)
Called and [facility] patient [facility] at 3:00 pm.Busick, Rodena Medinobert Lee

## 2013-10-23 NOTE — Progress Notes (Signed)
S: doing well. Feeling baby move. She reports clear fluid in her underwear at the end of the day that has been ongoing for about a week. She sees clear,white discharge with wiping.  She denies any worsening contractions. Gets tightening of the abdomen 3-4 times per day.  O: see flowsheet Pelvic exam: normal external genitalia, vulva, vagina, thick white discharge. No pooling.  A/P:  23 yo G2P1001 at 38.5wga who presents for routine OB follow up.  - clear fluid: pelvic not showing any pooling, thick discharge present. Negative nitrazine. Negative fern. Negative wet prep.  - fetal growth: fundal height greater than dates by 3. Will need to get an ultrasound for assessment of polyhydramnios and fetal weight. Patient is concerned about cost of test as it is not 100% covered by [employer].  - follow up in 1 week.

## 2013-10-30 ENCOUNTER — Ambulatory Visit (INDEPENDENT_AMBULATORY_CARE_PROVIDER_SITE_OTHER): Payer: No Typology Code available for payment source | Admitting: Family Medicine

## 2013-10-30 ENCOUNTER — Ambulatory Visit (HOSPITAL_COMMUNITY)
Admission: RE | Admit: 2013-10-30 | Discharge: 2013-10-30 | Disposition: A | Discharge status: Home / Self Care | Payer: No Typology Code available for payment source | Source: Ambulatory Visit | Attending: Family Medicine | Admitting: Family Medicine

## 2013-10-30 VITALS — BP 122/75 | Wt 214.0 lb

## 2013-10-30 DIAGNOSIS — O480 Post-term pregnancy: Secondary | ICD-10-CM

## 2013-10-30 DIAGNOSIS — Z3480 Encounter for supervision of other normal pregnancy, unspecified trimester: Secondary | ICD-10-CM

## 2013-10-30 DIAGNOSIS — O3660X0 Maternal care for excessive fetal growth, unspecified trimester, not applicable or unspecified: Secondary | ICD-10-CM | POA: Insufficient documentation

## 2013-10-30 NOTE — Progress Notes (Signed)
S: Presents for routine prenatal care. Doing well. Feeling more pressure and discomfort in her bottom and lower back. Feeling some small contractions when she walks. Lasting no more than a few seconds. Denies any loss of fluid, vaginal bleeding. Feels good fetal movement.  O: see flowsheet A/P: 23 year old G2 P1 at 1839 and [redacted] weeks gestational age who presents for routine followup. -Fundal height greater than dates: Patient is still measuring at 41 cm. She has an appointment for ultrasound to evaluate for polyhydramnios and fetal weight this afternoon. - Will schedule patient for NST and BPP in case she goes more than 40 weeks  - Contraception: Patient not sure what she wants to do. IUD did not work for her in the past. She does not want the depo shot. She's not good at taking birth control pills every day. She is suspicious of the nexplanon. Her husband uses condoms. - Followup in one week with me if hasn't delivered

## 2013-10-30 NOTE — Patient Instructions (Signed)
When you go to the MAU, give them my numbers:  Pager: [phone] or cell: [phone]  Follow up in 1 week in case you haven;t delivered yet.

## 2013-10-31 ENCOUNTER — Encounter (HOSPITAL_COMMUNITY): Payer: Self-pay | Admitting: *Deleted

## 2013-10-31 ENCOUNTER — Telehealth (HOSPITAL_COMMUNITY): Payer: Self-pay | Admitting: *Deleted

## 2013-10-31 ENCOUNTER — Telehealth: Payer: Self-pay | Admitting: Family Medicine

## 2013-10-31 ENCOUNTER — Inpatient Hospital Stay (HOSPITAL_COMMUNITY)
Admission: AD | Admit: 2013-10-31 | Discharge: 2013-10-31 | Disposition: A | Discharge status: Home / Self Care | Payer: No Typology Code available for payment source | Source: Ambulatory Visit | Attending: Obstetrics & Gynecology | Admitting: Obstetrics & Gynecology

## 2013-10-31 DIAGNOSIS — O479 False labor, unspecified: Secondary | ICD-10-CM | POA: Insufficient documentation

## 2013-10-31 NOTE — MAU Note (Signed)
Patient presents to MAU with irregular contractions that started yesterday and have increased in intensity. Denies LOF or VB at this time. Reports good fetal movement.

## 2013-10-31 NOTE — Discharge Instructions (Signed)
Fetal Movement Counts °Patient Name: __________________________________________________ Patient Due Date: ____________________ °Performing a fetal movement count is highly recommended in high-risk pregnancies, but it is good for every pregnant woman to do. Your caregiver may ask you to start counting fetal movements at 28 weeks of the pregnancy. Fetal movements often increase: °· After eating a full meal. °· After physical activity. °· After eating or drinking something sweet or cold. °· At rest. °Pay attention to when you feel the baby is most active. This will help you notice a pattern of your baby's sleep and wake cycles and what factors contribute to an increase in fetal movement. It is important to perform a fetal movement count at the same time each day when your baby is normally most active.  °HOW TO COUNT FETAL MOVEMENTS °1. Find a quiet and comfortable area to sit or lie down on your left side. Lying on your left side provides the best blood and oxygen circulation to your baby. °2. Write down the day and time on a sheet of paper or in a journal. °3. Start counting kicks, flutters, swishes, rolls, or jabs in a 2 hour period. You should feel at least 10 movements within 2 hours. °4. If you do not feel 10 movements in 2 hours, wait 2 3 hours and count again. Look for a change in the pattern or not enough counts in 2 hours. °SEEK MEDICAL CARE IF: °· You feel less than 10 counts in 2 hours, tried twice. °· There is no movement in over an hour. °· The pattern is changing or taking longer each day to reach 10 counts in 2 hours. °· You feel the baby is not moving as he or she usually does. °Date: ____________ Movements: ____________ Start time: ____________ Finish time: ____________  °Date: ____________ Movements: ____________ Start time: ____________ Finish time: ____________ °Date: ____________ Movements: ____________ Start time: ____________ Finish time: ____________ °Date: ____________ Movements: ____________  Start time: ____________ Finish time: ____________ °Date: ____________ Movements: ____________ Start time: ____________ Finish time: ____________ °Date: ____________ Movements: ____________ Start time: ____________ Finish time: ____________ °Date: ____________ Movements: ____________ Start time: ____________ Finish time: ____________ °Date: ____________ Movements: ____________ Start time: ____________ Finish time: ____________  °Date: ____________ Movements: ____________ Start time: ____________ Finish time: ____________ °Date: ____________ Movements: ____________ Start time: ____________ Finish time: ____________ °Date: ____________ Movements: ____________ Start time: ____________ Finish time: ____________ °Date: ____________ Movements: ____________ Start time: ____________ Finish time: ____________ °Date: ____________ Movements: ____________ Start time: ____________ Finish time: ____________ °Date: ____________ Movements: ____________ Start time: ____________ Finish time: ____________ °Date: ____________ Movements: ____________ Start time: ____________ Finish time: ____________  °Date: ____________ Movements: ____________ Start time: ____________ Finish time: ____________ °Date: ____________ Movements: ____________ Start time: ____________ Finish time: ____________ °Date: ____________ Movements: ____________ Start time: ____________ Finish time: ____________ °Date: ____________ Movements: ____________ Start time: ____________ Finish time: ____________ °Date: ____________ Movements: ____________ Start time: ____________ Finish time: ____________ °Date: ____________ Movements: ____________ Start time: ____________ Finish time: ____________ °Date: ____________ Movements: ____________ Start time: ____________ Finish time: ____________  °Date: ____________ Movements: ____________ Start time: ____________ Finish time: ____________ °Date: ____________ Movements: ____________ Start time: ____________ Finish time:  ____________ °Date: ____________ Movements: ____________ Start time: ____________ Finish time: ____________ °Date: ____________ Movements: ____________ Start time: ____________ Finish time: ____________ °Date: ____________ Movements: ____________ Start time: ____________ Finish time: ____________ °Date: ____________ Movements: ____________ Start time: ____________ Finish time: ____________ °Date: ____________ Movements: ____________ Start time: ____________ Finish time: ____________  °Date: ____________ Movements: ____________ Start time: ____________ Finish   time: ____________ °Date: ____________ Movements: ____________ Start time: ____________ Finish time: ____________ °Date: ____________ Movements: ____________ Start time: ____________ Finish time: ____________ °Date: ____________ Movements: ____________ Start time: ____________ Finish time: ____________ °Date: ____________ Movements: ____________ Start time: ____________ Finish time: ____________ °Date: ____________ Movements: ____________ Start time: ____________ Finish time: ____________ °Date: ____________ Movements: ____________ Start time: ____________ Finish time: ____________  °Date: ____________ Movements: ____________ Start time: ____________ Finish time: ____________ °Date: ____________ Movements: ____________ Start time: ____________ Finish time: ____________ °Date: ____________ Movements: ____________ Start time: ____________ Finish time: ____________ °Date: ____________ Movements: ____________ Start time: ____________ Finish time: ____________ °Date: ____________ Movements: ____________ Start time: ____________ Finish time: ____________ °Date: ____________ Movements: ____________ Start time: ____________ Finish time: ____________ °Date: ____________ Movements: ____________ Start time: ____________ Finish time: ____________  °Date: ____________ Movements: ____________ Start time: ____________ Finish time: ____________ °Date: ____________ Movements:  ____________ Start time: ____________ Finish time: ____________ °Date: ____________ Movements: ____________ Start time: ____________ Finish time: ____________ °Date: ____________ Movements: ____________ Start time: ____________ Finish time: ____________ °Date: ____________ Movements: ____________ Start time: ____________ Finish time: ____________ °Date: ____________ Movements: ____________ Start time: ____________ Finish time: ____________ °Date: ____________ Movements: ____________ Start time: ____________ Finish time: ____________  °Date: ____________ Movements: ____________ Start time: ____________ Finish time: ____________ °Date: ____________ Movements: ____________ Start time: ____________ Finish time: ____________ °Date: ____________ Movements: ____________ Start time: ____________ Finish time: ____________ °Date: ____________ Movements: ____________ Start time: ____________ Finish time: ____________ °Date: ____________ Movements: ____________ Start time: ____________ Finish time: ____________ °Date: ____________ Movements: ____________ Start time: ____________ Finish time: ____________ °Document Released: 08/24/2006 Document Revised: 07/11/2012 Document Reviewed: 05/21/2012 °ExitCare® Patient Information ©2014 [employer]. ° ° °Braxton Hicks Contractions °Pregnancy is commonly associated with contractions of the uterus throughout the pregnancy. Towards the end of pregnancy (32 to 34 weeks), these contractions (Braxton Hicks) can develop more often and may become more forceful. This is not true labor because these contractions do not result in opening (dilatation) and thinning of the cervix. They are sometimes difficult to tell apart from true labor because these contractions can be forceful and people have different pain tolerances. You should not feel embarrassed if you go to the hospital with false labor. Sometimes, the only way to tell if you are in true labor is for your caregiver to follow the changes  in the cervix. °How to tell the difference between true and false labor: °· False labor. °· The contractions of false labor are usually shorter, irregular and not as hard as those of true labor. °· They are often felt in the front of the lower abdomen and in the groin. °· They may leave with walking around or changing positions while lying down. °· They get weaker and are shorter lasting as time goes on. °· These contractions are usually irregular. °· They do not usually become progressively stronger, regular and closer together as with true labor. °· True labor. °· Contractions in true labor last 30 to 70 seconds, become very regular, usually become more intense, and increase in frequency. °· They do not go away with walking. °· The discomfort is usually felt in the top of the uterus and spreads to the lower abdomen and low back. °· True labor can be determined by your caregiver with an exam. This will show that the cervix is dilating and getting thinner. °If there are no prenatal problems or other health problems associated with the pregnancy, it is completely safe to be sent home with false labor and await the onset of   true labor. °HOME CARE INSTRUCTIONS  °· Keep up with your usual exercises and instructions. °· Take medications as directed. °· Keep your regular prenatal appointment. °· Eat and drink lightly if you think you are going into labor. °· If BH contractions are making you uncomfortable: °· Change your activity position from lying down or resting to walking/walking to resting. °· Sit and rest in a tub of warm water. °· Drink 2 to 3 glasses of water. Dehydration may cause B-H contractions. °· Do slow and deep breathing several times an hour. °SEEK IMMEDIATE MEDICAL CARE IF:  °· Your contractions continue to become stronger, more regular, and closer together. °· You have a gushing, burst or leaking of fluid from the vagina. °· An oral temperature above 102° F (38.9° C) develops. °· You have passage of  blood-tinged mucus. °· You develop vaginal bleeding. °· You develop continuous belly (abdominal) pain. °· You have low back pain that you never had before. °· You feel the baby's head pushing down causing pelvic pressure. °· The baby is not moving as much as it used to. °Document Released: 07/25/2005 Document Revised: 10/17/2011 Document Reviewed: 05/06/2013 °ExitCare® Patient Information ©2014 [employer]. ° ° °

## 2013-10-31 NOTE — Telephone Encounter (Signed)
Called patient to let her know of plan for induction tomorrow at 7am due to fetal LGA.  She states that she has been having increased pressure and contractions, multiple times in an hour, keeping her up at night.  Recommended that she get checked at the MAU for labor check. She agreed with plan.   Marena ChancyStephanie Jaonna Word, PGY-3 Family Medicine Resident

## 2013-10-31 NOTE — Telephone Encounter (Signed)
Preadmission screen  

## 2013-11-01 ENCOUNTER — Inpatient Hospital Stay (HOSPITAL_COMMUNITY)
Admission: RE | Admit: 2013-11-01 | Discharge: 2013-11-02 | DRG: 775 | Disposition: A | Discharge status: Home / Self Care | Payer: Medicaid Other | Source: Ambulatory Visit | Attending: Obstetrics & Gynecology | Admitting: Obstetrics & Gynecology

## 2013-11-01 ENCOUNTER — Encounter (HOSPITAL_COMMUNITY): Payer: Self-pay

## 2013-11-01 VITALS — BP 108/63 | HR 78 | Temp 98.2°F | Resp 18 | Ht 65.0 in | Wt 213.0 lb

## 2013-11-01 DIAGNOSIS — O3660X0 Maternal care for excessive fetal growth, unspecified trimester, not applicable or unspecified: Secondary | ICD-10-CM

## 2013-11-01 DIAGNOSIS — Z3490 Encounter for supervision of normal pregnancy, unspecified, unspecified trimester: Secondary | ICD-10-CM

## 2013-11-01 DIAGNOSIS — IMO0002 Reserved for concepts with insufficient information to code with codable children: Secondary | ICD-10-CM

## 2013-11-01 LAB — RPR: RPR: NONREACTIVE

## 2013-11-01 LAB — CBC
HCT: 37.7 % (ref 36.0–46.0)
HEMOGLOBIN: 12.8 g/dL (ref 12.0–15.0)
MCH: 29 pg (ref 26.0–34.0)
MCHC: 34 g/dL (ref 30.0–36.0)
MCV: 85.5 fL (ref 78.0–100.0)
Platelets: 204 10*3/uL (ref 150–400)
RBC: 4.41 MIL/uL (ref 3.87–5.11)
RDW: 14.8 % (ref 11.5–15.5)
WBC: 9.4 10*3/uL (ref 4.0–10.5)

## 2013-11-01 LAB — PREPARE RBC (CROSSMATCH)

## 2013-11-01 LAB — ABO/RH: ABO/RH(D): A POS

## 2013-11-01 MED ORDER — SENNOSIDES-DOCUSATE SODIUM 8.6-50 MG PO TABS
2.0000 | ORAL_TABLET | ORAL | Status: DC
  Administered 2013-11-01: 2 via ORAL
  Filled 2013-11-01: qty 2

## 2013-11-01 MED ORDER — ACETAMINOPHEN 325 MG PO TABS: 650.0000 mg | ORAL_TABLET | ORAL | Status: DC | PRN

## 2013-11-01 MED ORDER — OXYCODONE-ACETAMINOPHEN 5-325 MG PO TABS: 1.0000 | ORAL_TABLET | ORAL | Status: DC | PRN

## 2013-11-01 MED ORDER — WITCH HAZEL-GLYCERIN EX PADS: 1.0000 "application " | MEDICATED_PAD | CUTANEOUS | Status: DC | PRN

## 2013-11-01 MED ORDER — LIDOCAINE HCL (PF) 1 % IJ SOLN
30.0000 mL | INTRAMUSCULAR | Status: DC | PRN
  Filled 2013-11-01: qty 30

## 2013-11-01 MED ORDER — DIPHENHYDRAMINE HCL 50 MG/ML IJ SOLN: 12.5000 mg | INTRAMUSCULAR | Status: DC | PRN

## 2013-11-01 MED ORDER — LACTATED RINGERS IV SOLN
INTRAVENOUS | Status: DC
  Administered 2013-11-01: 125 mL/h via INTRAVENOUS

## 2013-11-01 MED ORDER — ZOLPIDEM TARTRATE 5 MG PO TABS: 5.0000 mg | ORAL_TABLET | Freq: Every evening | ORAL | Status: DC | PRN

## 2013-11-01 MED ORDER — OXYCODONE-ACETAMINOPHEN 5-325 MG PO TABS
1.0000 | ORAL_TABLET | ORAL | Status: DC | PRN
  Administered 2013-11-01: 1 via ORAL
  Filled 2013-11-01: qty 1

## 2013-11-01 MED ORDER — LACTATED RINGERS IV SOLN: 500.0000 mL | Freq: Once | INTRAVENOUS | Status: DC

## 2013-11-01 MED ORDER — IBUPROFEN 600 MG PO TABS
600.0000 mg | ORAL_TABLET | Freq: Four times a day (QID) | ORAL | Status: DC
  Administered 2013-11-01 – 2013-11-02 (×3): 600 mg via ORAL
  Filled 2013-11-01 (×4): qty 1

## 2013-11-01 MED ORDER — PRENATAL MULTIVITAMIN CH: 1.0000 | ORAL_TABLET | Freq: Every day | ORAL | Status: DC

## 2013-11-01 MED ORDER — FENTANYL 2.5 MCG/ML BUPIVACAINE 1/10 % EPIDURAL INFUSION (WH - ANES): 14.0000 mL/h | INTRAMUSCULAR | Status: DC | PRN

## 2013-11-01 MED ORDER — ONDANSETRON HCL 4 MG/2ML IJ SOLN: 4.0000 mg | Freq: Four times a day (QID) | INTRAMUSCULAR | Status: DC | PRN

## 2013-11-01 MED ORDER — OXYTOCIN 40 UNITS IN LACTATED RINGERS INFUSION - SIMPLE MED
1.0000 m[IU]/min | INTRAVENOUS | Status: DC
  Administered 2013-11-01: 2 m[IU]/min via INTRAVENOUS
  Filled 2013-11-01: qty 1000

## 2013-11-01 MED ORDER — ONDANSETRON HCL 4 MG PO TABS: 4.0000 mg | ORAL_TABLET | ORAL | Status: DC | PRN

## 2013-11-01 MED ORDER — EPHEDRINE 5 MG/ML INJ
10.0000 mg | INTRAVENOUS | Status: DC | PRN
  Filled 2013-11-01: qty 2

## 2013-11-01 MED ORDER — OXYTOCIN BOLUS FROM INFUSION: 500.0000 mL | INTRAVENOUS | Status: DC

## 2013-11-01 MED ORDER — MISOPROSTOL 200 MCG PO TABS
ORAL_TABLET | ORAL | Status: AC
  Filled 2013-11-01: qty 5

## 2013-11-01 MED ORDER — DIPHENHYDRAMINE HCL 25 MG PO CAPS: 25.0000 mg | ORAL_CAPSULE | Freq: Four times a day (QID) | ORAL | Status: DC | PRN

## 2013-11-01 MED ORDER — FENTANYL CITRATE 0.05 MG/ML IJ SOLN
100.0000 ug | INTRAMUSCULAR | Status: DC | PRN
  Administered 2013-11-01 (×2): 100 ug via INTRAVENOUS
  Filled 2013-11-01 (×2): qty 2

## 2013-11-01 MED ORDER — TERBUTALINE SULFATE 1 MG/ML IJ SOLN: 0.2500 mg | Freq: Once | INTRAMUSCULAR | Status: DC | PRN

## 2013-11-01 MED ORDER — IBUPROFEN 600 MG PO TABS
600.0000 mg | ORAL_TABLET | Freq: Four times a day (QID) | ORAL | Status: DC | PRN
Start: 2013-11-01 — End: 2013-11-02
  Administered 2013-11-01 – 2013-11-02 (×2): 600 mg via ORAL
  Filled 2013-11-01: qty 1

## 2013-11-01 MED ORDER — ONDANSETRON HCL 4 MG/2ML IJ SOLN: 4.0000 mg | INTRAMUSCULAR | Status: DC | PRN

## 2013-11-01 MED ORDER — TETANUS-DIPHTH-ACELL PERTUSSIS 5-2.5-18.5 LF-MCG/0.5 IM SUSP: 0.5000 mL | Freq: Once | INTRAMUSCULAR | Status: DC

## 2013-11-01 MED ORDER — CITRIC ACID-SODIUM CITRATE 334-500 MG/5ML PO SOLN: 30.0000 mL | ORAL | Status: DC | PRN

## 2013-11-01 MED ORDER — FLEET ENEMA 7-19 GM/118ML RE ENEM: 1.0000 | ENEMA | Freq: Every day | RECTAL | Status: DC | PRN

## 2013-11-01 MED ORDER — LACTATED RINGERS IV SOLN: 500.0000 mL | INTRAVENOUS | Status: DC | PRN

## 2013-11-01 MED ORDER — DIBUCAINE 1 % RE OINT: 1.0000 "application " | TOPICAL_OINTMENT | RECTAL | Status: DC | PRN

## 2013-11-01 MED ORDER — PRENATAL MULTIVITAMIN CH
1.0000 | ORAL_TABLET | Freq: Every day | ORAL | Status: DC
  Administered 2013-11-02: 1 via ORAL
  Filled 2013-11-01: qty 1

## 2013-11-01 MED ORDER — PHENYLEPHRINE 40 MCG/ML (10ML) SYRINGE FOR IV PUSH (FOR BLOOD PRESSURE SUPPORT)
80.0000 ug | PREFILLED_SYRINGE | INTRAVENOUS | Status: DC | PRN
  Filled 2013-11-01: qty 2

## 2013-11-01 MED ORDER — BENZOCAINE-MENTHOL 20-0.5 % EX AERO: 1.0000 "application " | INHALATION_SPRAY | CUTANEOUS | Status: DC | PRN

## 2013-11-01 MED ORDER — OXYTOCIN 40 UNITS IN LACTATED RINGERS INFUSION - SIMPLE MED
62.5000 mL/h | INTRAVENOUS | Status: DC
Start: 2013-11-01 — End: 2013-11-02
  Administered 2013-11-01: 62.5 mL/h via INTRAVENOUS

## 2013-11-01 MED ORDER — LANOLIN HYDROUS EX OINT: TOPICAL_OINTMENT | CUTANEOUS | Status: DC | PRN

## 2013-11-01 MED ORDER — SIMETHICONE 80 MG PO CHEW: 80.0000 mg | CHEWABLE_TABLET | ORAL | Status: DC | PRN

## 2013-11-01 NOTE — Progress Notes (Signed)
Natalie ComptonKarla Delangel [name] is a 23 y.o. G2P1001 at 3537w0d by LMP admitted for elective induction of labor due to macrosomia.  Subjective: Feeling more pain with contractions. Contracting every 2-3 minutes  Objective: BP 147/75  Pulse 89  Temp(Src) 98.3 F (36.8 C) (Oral)  Resp 18  Ht 5\' 5"  (1.651 m)  Wt 213 lb (96.616 kg)  BMI 35.44 kg/m2  LMP 01/25/2013      FHT:  FHR: 140 bpm, variability: moderate,  accelerations:  Present,  decelerations:  Present variable UC:   regular, every 2-3 minutes SVE:   Dilation: 8 Effacement (%): 100 Station: 0 Exam by:: bobbi Jarvis NewcomerJo Prichard RNC  Labs: Lab Results  Component Value Date   WBC 9.4 11/01/2013   HGB 12.8 11/01/2013   HCT 37.7 11/01/2013   MCV 85.5 11/01/2013   PLT 204 11/01/2013    Assessment / Plan: Induction of labor due to macrosomia,  progressing well on pitocin  Labor: progressing on pitocin. Not able to AROM Preeclampsia:  NA Fetal Wellbeing:  Category II Pain Control:  Fentanyl I/D:  GBS negative Anticipated MOD:  NSVD  Stephanye Finnicum 11/01/2013, 2:14 PM

## 2013-11-01 NOTE — Progress Notes (Signed)
Natalie Morgan is a 23 y.o. G2P1001 at 4279w0d admitted for elective IOL for macrosomia  Subjective:  Doing well. Some contractions but very mild.  +FM.   Objective: BP 128/69  Pulse 80  Temp(Src) 97.4 F (36.3 C) (Axillary)  Resp 20  Ht 5\' 5"  (1.651 m)  Wt 96.616 kg (213 lb)  BMI 35.44 kg/m2  LMP 01/25/2013      FHT:  FHR: 150 bpm, variability: moderate,  accelerations:  Present,  decelerations:  Absent UC:   irregular, every 1-8 minutes  SVE:   Dilation: 4 Effacement (%): 50 Station: -2 Exam by:: Dr. Reola CalkinsBeck  Labs: Lab Results  Component Value Date   WBC 9.4 11/01/2013   HGB 12.8 11/01/2013   HCT 37.7 11/01/2013   MCV 85.5 11/01/2013   PLT 204 11/01/2013    Assessment / Plan: IOL at term and presumed macrosomia (4296gm)   Labor: currently on pitocin. Discussed risks due to macrosomia including increased risk of cesarean section due to failure to descend. also discussed increased risk of  shoulder dystocia, tears, brachial plexus injurires, emergent c/s and possible fetal death.  Fetal Wellbeing:  Category I Pain Control:  Labor support without medications and planning for IV pain medicine.  I/D:  n/a Anticipated MOD:  NSVD  Leif Loflin L 11/01/2013, 10:27 AM

## 2013-11-01 NOTE — H&P (Signed)
Attestation of Attending Supervision of Advanced Practitioner (CNM/NP): Evaluation and management procedures were performed by the Advanced Practitioner under my supervision and collaboration.  I have reviewed the Advanced Practitioner's note and chart, and I agree with the management and plan.  HARRAWAY-SMITH, Dick Hark 11:13 AM  Attestation of Attending Supervision of Resident: Evaluation and management procedures were performed by the [facility] Res[facility] under my supervision.  I have seen and examined the patient, reviewed the resident's note and chart, and I agree with the management and plan. I have seen this patient and reexplained her risks.  She expresses understanding.  She does not want pain meds for this delivery.    Anibal Hendersonarolyn L Harraway-Smith, M.D. 11/01/2013 11:13 AM

## 2013-11-01 NOTE — H&P (Signed)
Natalie ComptonKarla Morgan Natalie Morgan is a 23 y.o. female G2P1001 at 40wga by LMP presenting for elective induction of labor for fetal LGA. No complications during the pregnancy other than increased maternal weight gain.  Patient was seen in the MAU for labor check and discharged with cervical check at 3cm. Since then, she has been feeling contractions every 5 minutes. She denies any loss of fluid, vaginal bleeding. She has been feeling fetal movement.  She denies headaches, blurred vision. She has had some mild lower extremity swelling.   History OB History   Grav Para Term Preterm Abortions TAB SAB Ect Mult Living   2 1 1       1      Past Medical History  Diagnosis Date  . Recurrent upper respiratory infection (URI)   . Asthma     no meds   Past Surgical History  Procedure Laterality Date  . Toenail excision     Family History: family history includes Cancer in her maternal aunt; Hypertension in her mother. Social History:  reports that she has never smoked. She does not have any smokeless tobacco history on file. She reports that she does not drink alcohol or use illicit drugs.   Prenatal Transfer Tool  Maternal Diabetes: No: failed 1hr glucola: 144 on 08/12/13. 3hr glucola: 97/159/136/113 Genetic Screening: Declined Maternal Ultrasounds/Referrals: Abnormal:  Findings:   Other:aortic arch and ductal arch not visualized on 06/22/14 US, otherwise normal anatomy.  US 10/30/13: EFW: 4265 >90th percentile Fetal Ultrasounds or other Referrals:  None Maternal Substance Abuse:  No Significant Maternal Medications:  None Significant Maternal Lab Results:  None Other Comments:  None  ROS Negative except per HPI   Last menstrual period 01/25/2013. Exam Physical Exam  General: well appearing, in no acute distress, A+Ox4 HEENT: moist mucous membranes, EOMI CV: S1S2, RRR, no murmur Pulm: CTA bilaterally, no wheezing, normal work of breathing ABdomen: gravid Ext: non pitting pedal edema  Cervical  exam: 4, 50%, soft, middle, -2, vertex Fetal monitor:  Baseline: 150, moderate variability, accels: present, decels: early, contractions: irregular every 5-10 minutes.   Prenatal labs: ABO, Rh: A/POS/-- (08/20 1411) Antibody: NEG (08/20 1411) Rubella: 2.12 (08/20 1411) RPR: NON REAC (08/20 1411)  HBsAg: NEGATIVE (08/20 1411)  HIV: NON REACTIVE (08/20 1411)  GBS: NEGATIVE (02/27 1426)   Assessment/Plan: 23 yo G2P1001 at 40wga presenting for IOL for fetal LGA. - Labor: cervix favorable and patient technically in early labor on arrival. Will augment with pitocin, then AROM - Fetal monitoring: category 2 - ID: GBS negative - pain management: fentanyl - expected mode of delivery: SVD  Natalie ChancyStephanie Morgan, PGY-3 Family Medicine Resident    Natalie ChancyLOSQ, Natalie 11/01/2013, 8:19 AM  I have seen and examined this patient and agree with above documentation in the resident's note. Please see progress note for discussion due to macrosomia. EFW 4269gm with AC>97th%ile  Rulon AbideKeli Hazaiah Edgecombe, M.D. [facility] Fellow 11/01/2013 10:39 AM

## 2013-11-02 LAB — CBC
HEMATOCRIT: 31 % — AB (ref 36.0–46.0)
Hemoglobin: 10.5 g/dL — ABNORMAL LOW (ref 12.0–15.0)
MCH: 29.4 pg (ref 26.0–34.0)
MCHC: 34.2 g/dL (ref 30.0–36.0)
MCV: 86.1 fL (ref 78.0–100.0)
Platelets: 190 10*3/uL (ref 150–400)
RBC: 3.6 MIL/uL — ABNORMAL LOW (ref 3.87–5.11)
RDW: 14.9 % (ref 11.5–15.5)
WBC: 10.4 10*3/uL (ref 4.0–10.5)

## 2013-11-02 MED ORDER — SENNOSIDES-DOCUSATE SODIUM 8.6-50 MG PO TABS: 2.0000 | ORAL_TABLET | ORAL | Status: DC

## 2013-11-02 MED ORDER — IBUPROFEN 600 MG PO TABS: 600.0000 mg | ORAL_TABLET | Freq: Four times a day (QID) | ORAL | Status: DC | PRN

## 2013-11-02 MED ORDER — FERROUS SULFATE 325 (65 FE) MG PO TABS: 325.0000 mg | ORAL_TABLET | Freq: Two times a day (BID) | ORAL | Status: DC

## 2013-11-02 NOTE — Lactation Note (Addendum)
This note was copied from the chart of [facility]. Lactation Consultation Note Initial consult:  [name] 124 hours old and sleeping in visitors arms. Mother states baby breastfed for 30 min at 1438 and is latching better than her brother. LC reviewed hand expression, mother states she is expressing breastmilk prior to latching baby. Mother states baby cluster fed last night. Reviewed feeding cues, basics, lactation support services and brochure. Encouraged mother to call if she needs assistance breastfeeding.  Patient Name: Natalie Morgan Today's Date: 11/02/2013 Reason for consult: Initial assessment   Maternal Data Infant to breast within first hour of birth: Yes Has patient been taught Hand Expression?: Yes (LC reviewed ) Does the patient have breastfeeding experience prior to this delivery?: Yes  Feeding Feeding Type: Breast Fed Length of feed: 30 min  LATCH Score/Interventions                      Lactation Tools Discussed/Used     Consult Status Consult Status: Follow-up Date: 11/03/13 Follow-up type: In-patient    Natalie Morgan, Natalie Morgan [facility] 11/02/2013, 3:17 PM

## 2013-11-02 NOTE — Discharge Summary (Signed)
Obstetric Discharge Summary 23 yo G2P1001 at 40wga who presented for elective IOL due to macrosomia: EFW: 4269g with AC>97th percentile on 39.5wga US. Induction with pitocin. Complications during pregnancy: excessive weight gain. 1hr glucola failed, 3hr passed. Reason for Admission: induction of labor Prenatal Procedures: none Intrapartum Procedures: spontaneous vaginal delivery Postpartum Procedures: none Complications-Operative and Postpartum: none Hemoglobin  Date Value Ref Range Status  11/02/2013 10.5* 12.0 - 15.0 g/dL Final     REPEATED TO VERIFY     DELTA CHECK NOTED  12/21/2012 13.5  12.2 - 16.2 g/dL Final     capillary sample     HCT  Date Value Ref Range Status  11/02/2013 31.0* 36.0 - 46.0 % Final    Physical Exam:  General: alert, cooperative and no distress Lochia: appropriate Uterine Fundus: firm DVT Evaluation: No evidence of DVT seen on physical exam. Negative Homan's sign.  Discharge Diagnoses: Term Pregnancy-delivered  Discharge Information: Date: 11/02/2013 Activity: pelvic rest Diet: routine Medications: PNV, Ibuprofen, Colace and Iron Condition: stable Instructions: refer to practice specific booklet Discharge to: home Feeding Preference: Breastfeeding Contraception: condoms, discussed other options and patient undecided at this time  Newborn Data: Live born female  Birth Weight: 8 lb 13.4 oz (4009 g) APGAR: 8, 9  Home with mother: baby expected to be discharged 11/03/13  Marena ChancyLOSQ, STEPHANIE 11/02/2013, 8:49 AM  I spoke with and examined patient and agree with resident's note and plan of care.  Tawana ScaleMichael Ryan Tamim Skog, MD OB Fellow 11/02/2013 11:20 AM

## 2013-11-02 NOTE — Discharge Instructions (Signed)

## 2013-11-02 NOTE — Discharge Summary (Signed)
Attestation of Attending Supervision of Obstetric Fellow: Evaluation and management procedures were performed by the Obstetric Fellow under my supervision and collaboration.  I have reviewed the Obstetric Fellow's note and chart, and I agree with the management and plan.  Tobin Witucki, MD, FACOG Attending Obstetrician & Gynecologist Faculty Practice, [facility]   

## 2013-11-03 ENCOUNTER — Ambulatory Visit: Payer: Self-pay

## 2013-11-03 LAB — TYPE AND SCREEN
ABO/RH(D): A POS
Antibody Screen: NEGATIVE
UNIT DIVISION: 0
Unit division: 0

## 2013-11-03 NOTE — Lactation Note (Signed)
This note was copied from the [facility]. Lactation Consultation Note Follow up consult: P2 Ex BF Mother has baby in cross cradle hold.  Sucks and swallows observed. Mother's nipples hurt in the first minutes of feeding.  Encouraged using breast compression to achieve a deeper latch. Reviewed engorgement care, comfort gels and use, pacifier use and encouraged mother to call if further assistance is needed. Patient Name: Natalie Arbutus LeasKarla Delangel Morales RUEAV'WToday's Date: 11/03/2013 Reason for consult: Follow-up assessment   Maternal Data    Feeding Feeding Type: Breast Fed  LATCH Score/Interventions Latch: Grasps breast easily, tongue down, lips flanged, rhythmical sucking.  Audible Swallowing: Spontaneous and intermittent Intervention(s): Hand expression  Type of Nipple: Everted at rest and after stimulation  Comfort (Breast/Nipple): Filling, red/small blisters or bruises, mild/mod discomfort  Problem noted: Mild/Moderate discomfort Interventions (Mild/moderate discomfort): Comfort gels  Hold (Positioning): No assistance needed to correctly position infant at breast.  LATCH Score: 9  Lactation Tools Discussed/Used     Consult Status Consult Status: PRN    Hardie PulleyBerkelhammer, Ruth Boschen 11/03/2013, 8:42 AM

## 2013-11-04 NOTE — Progress Notes (Signed)
Post discharged review completed. 

## 2013-11-06 ENCOUNTER — Encounter: Payer: No Typology Code available for payment source | Admitting: Family Medicine

## 2013-11-06 ENCOUNTER — Other Ambulatory Visit: Payer: No Typology Code available for payment source

## 2013-11-27 ENCOUNTER — Ambulatory Visit (INDEPENDENT_AMBULATORY_CARE_PROVIDER_SITE_OTHER): Payer: No Typology Code available for payment source | Admitting: Family Medicine

## 2013-11-27 ENCOUNTER — Encounter: Payer: Self-pay | Admitting: Family Medicine

## 2013-11-27 VITALS — BP 109/56 | HR 80 | Temp 98.2°F | Ht 65.0 in | Wt 181.0 lb

## 2013-11-27 DIAGNOSIS — N760 Acute vaginitis: Secondary | ICD-10-CM

## 2013-11-27 MED ORDER — FLUCONAZOLE 150 MG PO TABS: 150.0000 mg | ORAL_TABLET | ORAL | Status: DC

## 2013-11-27 MED ORDER — CLOTRIMAZOLE 2 % VA CREA: 1.0000 | TOPICAL_CREAM | Freq: Every day | VAGINAL | Status: DC

## 2013-11-27 NOTE — Progress Notes (Signed)
   Subjective:    Patient ID: Natalie Morgan, female    DOB: 09/21/1990, 23 y.o.   MRN: [id number] CC: vaginal discharge  HPI 23 yo F presents for SD visit with vaginal discharge x 3 days. Before discharge she had labial itching. She is 4 weeks post partum NSVD. She has not had sex yet.  She has not had antibiotics. She tried OTC vagisil w/o improvement.   Soc hx: non smoker  Review of Systems As per HPI     Objective:   Physical Exam BP 109/56  Pulse 80  Temp(Src) 98.2 F (36.8 C) (Oral)  Ht 5\' 5"  (1.651 m)  Wt 181 lb (82.101 kg)  BMI 30.12 kg/m2 General appearance: alert, cooperative and no distress Pelvic: cervix normal in appearance, external genitalia normal, no adnexal masses or tenderness, no cervical motion tenderness, positive findings: vaginal discharge:  white, thick and mucoid, rectovaginal septum normal and uterus normal size, shape, and consistency     Assessment & Plan:

## 2013-11-27 NOTE — Assessment & Plan Note (Signed)
A: vulvovaginitis P: F/u wet prep In the meantime start diflucan 1 pill now and one pill in 3 days. Also nightly clotrimazole cream for 7 days.

## 2013-11-27 NOTE — Patient Instructions (Signed)
Natalie Morgan,  Thank you for coming in today. Congratulations on your baby girl. We will be in touch with wet prep results. In the meantime start diflucan 1 pill now and one pill in 3 days. Also nightly clotrimazole cream for 7 days.  Dr. Armen PickupFunches

## 2013-11-28 ENCOUNTER — Telehealth: Payer: Self-pay | Admitting: Family Medicine

## 2013-11-28 DIAGNOSIS — N760 Acute vaginitis: Secondary | ICD-10-CM

## 2013-11-28 LAB — POCT WET PREP (WET MOUNT): CLUE CELLS WET PREP WHIFF POC: NEGATIVE

## 2013-11-28 NOTE — Addendum Note (Signed)
Addended by: Jennette BillBUSICK, Roben Schliep L on: 11/28/2013 03:49 PM   Modules accepted: Orders

## 2013-11-28 NOTE — Assessment & Plan Note (Signed)
Called patient. Negative wet prep. Only thing on wet prep was WBC similar to last wet prep. Could still be yeast other than candida albicans, like candida glabrata-of note the discharge was not curdlike. Patient with persistent itching. No improvement yet.  Denies changes in skin products.  Plan to complete treatment with diflucan and clotrimazole.  Patient advised to f/u for re-evaluation prn persistent symptoms.     

## 2013-11-28 NOTE — Telephone Encounter (Signed)
Called patient. Negative wet prep. Only thing on wet prep was WBC similar to last wet prep. Could still be yeast other than candida albicans, like candida glabrata-of note the discharge was not curdlike. Patient with persistent itching. No improvement yet.  Denies changes in skin products.  Plan to complete treatment with diflucan and clotrimazole.  Patient advised to f/u for re-evaluation prn persistent symptoms.

## 2013-12-10 ENCOUNTER — Encounter: Payer: Self-pay | Admitting: Family Medicine

## 2013-12-10 ENCOUNTER — Ambulatory Visit (INDEPENDENT_AMBULATORY_CARE_PROVIDER_SITE_OTHER): Payer: No Typology Code available for payment source | Admitting: Family Medicine

## 2013-12-10 VITALS — BP 100/68 | HR 68 | Temp 98.1°F | Ht 65.0 in | Wt 179.0 lb

## 2013-12-10 DIAGNOSIS — N898 Other specified noninflammatory disorders of vagina: Secondary | ICD-10-CM

## 2013-12-10 DIAGNOSIS — N7689 Other specified inflammation of vagina and vulva: Secondary | ICD-10-CM

## 2013-12-10 DIAGNOSIS — N939 Abnormal uterine and vaginal bleeding, unspecified: Secondary | ICD-10-CM

## 2013-12-10 DIAGNOSIS — N72 Inflammatory disease of cervix uteri: Secondary | ICD-10-CM

## 2013-12-10 DIAGNOSIS — L309 Dermatitis, unspecified: Secondary | ICD-10-CM

## 2013-12-10 LAB — POCT URINE PREGNANCY: Preg Test, Ur: NEGATIVE

## 2013-12-10 MED ORDER — TRIAMCINOLONE ACETONIDE 0.1 % EX CREA: 1.0000 "application " | TOPICAL_CREAM | Freq: Every day | CUTANEOUS | Status: DC

## 2013-12-10 NOTE — Progress Notes (Signed)
Patient ID: Arbutus LeasKarla Delangel Morales    DOB: 09/03/1990, 23 y.o.   MRN: [id number] --- Subjective:  Paula ComptonKarla is a 23 y.o.female who presents with continued vaginal itching. She was seen on 11/27/13 and treated for yeast with fluconazole x2. Wet prep was negative at the time.  Since then, she has been using summer's eve and vagisil. The vagisil has been burning the area. She denies any new soaps or detergents.  The itching has mildly improved since start of symptoms but continues to persist. Itching is located on left side of vulva. She has also had an associated bump on the left labia that is not painful and not itchy without drainage.  She denies any vaginal discharge.  She denies any h/o STD's or exposure of her husband to STD's.  She is sexually active.  She reports 3 days of heavy bleeding over the weekend that has since stopped. She is about 2 months post partum.   ROS: see HPI Past Medical History: reviewed and updated medications and allergies. Social History: Tobacco: none  Objective: Filed Vitals:   12/10/13 1542  BP: 100/68  Pulse: 68  Temp: 98.1 F (36.7 C)    Physical Examination:   General appearance - alert, well appearing, and in no distress GU - very mild erythema of left labia majora. No ulcerations, no hypertrophy or discoloration. Small less than 0.5cm   UPT: negative.

## 2013-12-10 NOTE — Patient Instructions (Signed)
Let's try the topical steroid cream and see how you do.  If it's not better in 2 weeks, come back and we'll reassess.

## 2013-12-12 DIAGNOSIS — L309 Dermatitis, unspecified: Secondary | ICD-10-CM | POA: Insufficient documentation

## 2013-12-12 NOTE — Assessment & Plan Note (Addendum)
Will attempt trial of triamcinolone to area. Patient has been treated for yeast which did not clear issue. Wet prep not obtained today since patient did not report any discharge.  Reviewed quitting summer's eve and other irritants.  If not better in 2 weeks, patient to return to care for further evaluation.

## 2014-01-06 ENCOUNTER — Encounter: Payer: Self-pay | Admitting: Family Medicine

## 2014-01-06 DIAGNOSIS — Z0000 Encounter for general adult medical examination without abnormal findings: Secondary | ICD-10-CM

## 2014-01-06 NOTE — Progress Notes (Signed)
Pt need appt for dental clinic for cleaning.

## 2014-01-07 NOTE — Progress Notes (Signed)
Forward to PCP to place referral, will then forward to [employer] to complete referral process.Natalie Morgan

## 2014-01-08 NOTE — Progress Notes (Signed)
Placing referral for dental for teeth cleaning.   Natalie Morgan, PGY-3 Family Medicine Resident

## 2014-04-23 ENCOUNTER — Ambulatory Visit: Payer: Self-pay

## 2014-06-09 ENCOUNTER — Encounter: Payer: Self-pay | Admitting: Family Medicine

## 2014-08-08 NOTE — L&D Delivery Note (Cosign Needed)
Delivery Note At 5:31 PM a viable female was delivered via  (Presentation:vertex ; ROA ).  APGAR8 ,9 ; weight  .   Placenta status:spont ,via duncans mechanism .  Cord: 3 vc with the following complications: .  Cord pH: n/a  Anesthesia:none   Episiotomy:  none Lacerations: none  Suture Repair: none Est. Blood Loss200 (mL):    Mom to postpartum.  Baby to Couplet care / Skin to Skin.  Wyvonnia DuskyLAWSON, MARIE DARLENE 06/14/2015, 5:40 PM

## 2014-09-23 ENCOUNTER — Ambulatory Visit: Payer: Self-pay

## 2014-09-30 ENCOUNTER — Ambulatory Visit: Payer: Self-pay

## 2014-10-22 ENCOUNTER — Ambulatory Visit: Payer: Self-pay

## 2014-11-18 LAB — PROCEDURE REPORT - SCANNED: Pap: NEGATIVE

## 2014-12-10 LAB — OB RESULTS CONSOLE PLATELET COUNT: Platelets: 245 10*3/uL

## 2014-12-10 LAB — OB RESULTS CONSOLE GC/CHLAMYDIA
CHLAMYDIA, DNA PROBE: NEGATIVE
GC PROBE AMP, GENITAL: NEGATIVE

## 2014-12-10 LAB — OB RESULTS CONSOLE RPR
RPR: NONREACTIVE
RPR: NONREACTIVE

## 2014-12-10 LAB — OB RESULTS CONSOLE HGB/HCT, BLOOD
HCT: 35 %
Hemoglobin: 11.7 g/dL

## 2014-12-10 LAB — CYTOLOGY - PAP: CYTOLOGY - PAP: NEGATIVE

## 2014-12-10 LAB — OB RESULTS CONSOLE RUBELLA ANTIBODY, IGM: RUBELLA: IMMUNE

## 2015-01-16 ENCOUNTER — Other Ambulatory Visit (HOSPITAL_COMMUNITY): Payer: Self-pay | Admitting: Obstetrics

## 2015-01-16 DIAGNOSIS — IMO0002 Reserved for concepts with insufficient information to code with codable children: Secondary | ICD-10-CM

## 2015-01-16 DIAGNOSIS — Z0489 Encounter for examination and observation for other specified reasons: Secondary | ICD-10-CM

## 2015-01-20 ENCOUNTER — Other Ambulatory Visit (HOSPITAL_COMMUNITY): Payer: Self-pay | Admitting: Obstetrics

## 2015-01-20 ENCOUNTER — Ambulatory Visit (HOSPITAL_COMMUNITY)
Admission: RE | Admit: 2015-01-20 | Discharge: 2015-01-20 | Disposition: A | Discharge status: Home / Self Care | Payer: Self-pay | Source: Ambulatory Visit | Attending: Obstetrics | Admitting: Obstetrics

## 2015-01-20 DIAGNOSIS — Z3A18 18 weeks gestation of pregnancy: Secondary | ICD-10-CM | POA: Insufficient documentation

## 2015-01-20 DIAGNOSIS — Z36 Encounter for antenatal screening of mother: Secondary | ICD-10-CM | POA: Insufficient documentation

## 2015-01-20 DIAGNOSIS — IMO0002 Reserved for concepts with insufficient information to code with codable children: Secondary | ICD-10-CM

## 2015-01-20 DIAGNOSIS — Z0489 Encounter for examination and observation for other specified reasons: Secondary | ICD-10-CM

## 2015-01-20 DIAGNOSIS — O4440 Low lying placenta NOS or without hemorrhage, unspecified trimester: Secondary | ICD-10-CM | POA: Insufficient documentation

## 2015-01-20 DIAGNOSIS — Z3A19 19 weeks gestation of pregnancy: Secondary | ICD-10-CM | POA: Insufficient documentation

## 2015-01-20 DIAGNOSIS — Z3689 Encounter for other specified antenatal screening: Secondary | ICD-10-CM | POA: Insufficient documentation

## 2015-01-20 DIAGNOSIS — O4402 Placenta previa specified as without hemorrhage, second trimester: Secondary | ICD-10-CM | POA: Insufficient documentation

## 2015-03-02 LAB — OB RESULTS CONSOLE HIV ANTIBODY (ROUTINE TESTING): HIV: NONREACTIVE

## 2015-03-04 ENCOUNTER — Ambulatory Visit: Payer: Self-pay

## 2015-03-11 ENCOUNTER — Inpatient Hospital Stay (HOSPITAL_COMMUNITY)
Admission: AD | Admit: 2015-03-11 | Discharge: 2015-03-12 | Disposition: A | Discharge status: Home / Self Care | Payer: Self-pay | Source: Ambulatory Visit | Attending: Obstetrics | Admitting: Obstetrics

## 2015-03-11 ENCOUNTER — Inpatient Hospital Stay (HOSPITAL_COMMUNITY): Payer: Self-pay

## 2015-03-11 ENCOUNTER — Encounter (HOSPITAL_COMMUNITY): Payer: Self-pay | Admitting: *Deleted

## 2015-03-11 DIAGNOSIS — R102 Pelvic and perineal pain: Secondary | ICD-10-CM | POA: Insufficient documentation

## 2015-03-11 DIAGNOSIS — Z3A27 27 weeks gestation of pregnancy: Secondary | ICD-10-CM | POA: Insufficient documentation

## 2015-03-11 DIAGNOSIS — O9989 Other specified diseases and conditions complicating pregnancy, childbirth and the puerperium: Secondary | ICD-10-CM | POA: Insufficient documentation

## 2015-03-11 DIAGNOSIS — O26892 Other specified pregnancy related conditions, second trimester: Secondary | ICD-10-CM

## 2015-03-11 MED ORDER — GI COCKTAIL ~~LOC~~
30.0000 mL | Freq: Once | ORAL | Status: AC
  Administered 2015-03-11: 30 mL via ORAL
  Filled 2015-03-11: qty 30

## 2015-03-11 NOTE — MAU Note (Signed)
Pt states she is having really abd pain that feels like contractions x one hour. Denies ROM or bleeding.

## 2015-03-11 NOTE — Discharge Instructions (Signed)
Third Trimester of Pregnancy The third trimester is from week 29 through week 42, months 7 through 9. The third trimester is a time when the fetus is growing rapidly. At the end of the ninth month, the fetus is about 20 inches in length and weighs 6-10 pounds.  BODY CHANGES Your body goes through many changes during pregnancy. The changes vary from woman to woman.   Your weight will continue to increase. You can expect to gain 25-35 pounds (11-16 kg) by the end of the pregnancy.  You may begin to get stretch marks on your hips, abdomen, and breasts.  You may urinate more often because the fetus is moving lower into your pelvis and pressing on your bladder.  You may develop or continue to have heartburn as a result of your pregnancy.  You may develop constipation because certain hormones are causing the muscles that push waste through your intestines to slow down.  You may develop hemorrhoids or swollen, bulging veins (varicose veins).  You may have pelvic pain because of the weight gain and pregnancy hormones relaxing your joints between the bones in your pelvis. Backaches may result from overexertion of the muscles supporting your posture.  You may have changes in your hair. These can include thickening of your hair, rapid growth, and changes in texture. Some women also have hair loss during or after pregnancy, or hair that feels dry or thin. Your hair will most likely return to normal after your baby is born.  Your breasts will continue to grow and be tender. A yellow discharge may leak from your breasts called colostrum.  Your belly button may stick out.  You may feel short of breath because of your expanding uterus.  You may notice the fetus "dropping," or moving lower in your abdomen.  You may have a bloody mucus discharge. This usually occurs a few days to a week before labor begins.  Your cervix becomes thin and soft (effaced) near your due date. WHAT TO EXPECT AT YOUR PRENATAL  EXAMS  You will have prenatal exams every 2 weeks until week 36. Then, you will have weekly prenatal exams. During a routine prenatal visit:  You will be weighed to make sure you and the fetus are growing normally.  Your blood pressure is taken.  Your abdomen will be measured to track your baby's growth.  The fetal heartbeat will be listened to.  Any test results from the previous visit will be discussed.  You may have a cervical check near your due date to see if you have effaced. At around 36 weeks, your caregiver will check your cervix. At the same time, your caregiver will also perform a test on the secretions of the vaginal tissue. This test is to determine if a type of bacteria, Group B streptococcus, is present. Your caregiver will explain this further. Your caregiver may ask you:  What your birth plan is.  How you are feeling.  If you are feeling the baby move.  If you have had any abnormal symptoms, such as leaking fluid, bleeding, severe headaches, or abdominal cramping.  If you have any questions. Other tests or screenings that may be performed during your third trimester include:  Blood tests that check for low iron levels (anemia).  Fetal testing to check the health, activity level, and growth of the fetus. Testing is done if you have certain medical conditions or if there are problems during the pregnancy. FALSE LABOR You may feel small, irregular contractions that   eventually go away. These are called Braxton Hicks contractions, or false labor. Contractions may last for hours, days, or even weeks before true labor sets in. If contractions come at regular intervals, intensify, or become painful, it is best to be seen by your caregiver.  SIGNS OF LABOR   Menstrual-like cramps.  Contractions that are 5 minutes apart or less.  Contractions that start on the top of the uterus and spread down to the lower abdomen and back.  A sense of increased pelvic pressure or back  pain.  A watery or bloody mucus discharge that comes from the vagina. If you have any of these signs before the 37th week of pregnancy, call your caregiver right away. You need to go to the hospital to get checked immediately. HOME CARE INSTRUCTIONS   Avoid all smoking, herbs, alcohol, and unprescribed drugs. These chemicals affect the formation and growth of the baby.  Follow your caregiver's instructions regarding medicine use. There are medicines that are either safe or unsafe to take during pregnancy.  Exercise only as directed by your caregiver. Experiencing uterine cramps is a good sign to stop exercising.  Continue to eat regular, healthy meals.  Wear a good support bra for breast tenderness.  Do not use hot tubs, steam rooms, or saunas.  Wear your seat belt at all times when driving.  Avoid raw meat, uncooked cheese, cat litter boxes, and soil used by cats. These carry germs that can cause birth defects in the baby.  Take your prenatal vitamins.  Try taking a stool softener (if your caregiver approves) if you develop constipation. Eat more high-fiber foods, such as fresh vegetables or fruit and whole grains. Drink plenty of fluids to keep your urine clear or pale yellow.  Take warm sitz baths to soothe any pain or discomfort caused by hemorrhoids. Use hemorrhoid cream if your caregiver approves.  If you develop varicose veins, wear support hose. Elevate your feet for 15 minutes, 3-4 times a day. Limit salt in your diet.  Avoid heavy lifting, wear low heal shoes, and practice good posture.  Rest a lot with your legs elevated if you have leg cramps or low back pain.  Visit your dentist if you have not gone during your pregnancy. Use a soft toothbrush to brush your teeth and be gentle when you floss.  A sexual relationship may be continued unless your caregiver directs you otherwise.  Do not travel far distances unless it is absolutely necessary and only with the approval  of your caregiver.  Take prenatal classes to understand, practice, and ask questions about the labor and delivery.  Make a trial run to the hospital.  Pack your hospital bag.  Prepare the baby's nursery.  Continue to go to all your prenatal visits as directed by your caregiver. SEEK MEDICAL CARE IF:  You are unsure if you are in labor or if your water has broken.  You have dizziness.  You have mild pelvic cramps, pelvic pressure, or nagging pain in your abdominal area.  You have persistent nausea, vomiting, or diarrhea.  You have a bad smelling vaginal discharge.  You have pain with urination. SEEK IMMEDIATE MEDICAL CARE IF:   You have a fever.  You are leaking fluid from your vagina.  You have spotting or bleeding from your vagina.  You have severe abdominal cramping or pain.  You have rapid weight loss or gain.  You have shortness of breath with chest pain.  You notice sudden or extreme swelling   of your face, hands, ankles, feet, or legs.  You have not felt your baby move in over an hour.  You have severe headaches that do not go away with medicine.  You have vision changes. Document Released: 07/19/2001 Document Revised: 07/30/2013 Document Reviewed: 09/25/2012 [employer] Patient Information 2015 [employer]. This information is not intended to replace advice given to you by your health care provider. Make sure you discuss any questions you have with your health care provider.  

## 2015-03-11 NOTE — MAU Provider Note (Signed)
History     CSN: [id number]  Arrival date and time: 03/11/15 2127   First Provider Initiated Contact with Patient 03/11/15 2215      Chief Complaint  Patient presents with  . Abdominal Pain   HPI Comments: Natalie Morgan is a 24 y.o. G3P2002 at [redacted]w[redacted]d who presents today with abdominal pain. She denies any vaginal bleeding or LOF. She states that the fetus has been active. She has a low-lying placenta.   Abdominal Pain This is a new problem. The current episode started today (at 2000). The onset quality is gradual. The problem occurs constantly (but with intermittent worse pain ). The problem has been unchanged. The pain is located in the generalized abdominal region. The pain is at a severity of 6/10. The quality of the pain is cramping. The abdominal pain does not radiate. Associated symptoms include nausea. Pertinent negatives include no constipation, diarrhea, dysuria, fever, frequency or vomiting. Nothing aggravates the pain. The pain is relieved by nothing. She has tried nothing for the symptoms.     Past Medical History  Diagnosis Date  . Recurrent upper respiratory infection (URI)   . Asthma     no meds    Past Surgical History  Procedure Laterality Date  . Toenail excision      Family History  Problem Relation Age of Onset  . Cancer Maternal Aunt   . Hypertension Mother     History  Substance Use Topics  . Smoking status: Never Smoker   . Smokeless tobacco: Not on file  . Alcohol Use: No    Allergies: No Known Allergies  Prescriptions prior to admission  Medication Sig Dispense Refill Last Dose  . albuterol (PROVENTIL HFA;VENTOLIN HFA) 108 (90 BASE) MCG/ACT inhaler Inhale 2 puffs into the lungs every 6 (six) hours as needed for wheezing or shortness of breath.   rescue  . OVER THE COUNTER MEDICATION Take 1 packet by mouth daily. "Starbein", a supplement that is added to water. Includes folic acid.   03/11/2015 at 1200  . Prenatal Multivit-Min-Fe-FA  (PRENATAL VITAMINS PO) Take 1 tablet by mouth daily.   03/11/2015 at 1100  . [DISCONTINUED] triamcinolone cream (KENALOG) 0.1 % Apply 1 application topically daily. (Patient not taking: Reported on 03/11/2015) 45 g 1 Not Taking at Unknown time    Review of Systems  Constitutional: Negative for fever.  Gastrointestinal: Positive for nausea and abdominal pain. Negative for vomiting, diarrhea and constipation.  Genitourinary: Negative for dysuria, urgency and frequency.   Physical Exam   Blood pressure 109/61, pulse 101, temperature 98.4 F (36.9 C), temperature source Oral, resp. rate 20, height  (1.651 m), weight 88.451 kg (195 lb), SpO2 100 %, currently breastfeeding.  Physical Exam  Nursing note and vitals reviewed. Constitutional: She is oriented to person, place, and time. She appears well-developed and well-nourished. No distress.  HENT:  Head: Normocephalic.  Cardiovascular: Normal rate.   Respiratory: Effort normal.  GI: Soft. There is tenderness (mildly, diffusly tender ). There is no rebound.  Neurological: She is alert and oriented to person, place, and time.  Skin: Skin is warm and dry.  Psychiatric: She has a normal mood and affect.   FHT: 135, moderate with 15x15 accel, no decels Toco: some UI   [place]: previa resolved Cervical length 4.4 cm  MAU Course  Procedures  MDM 2342: Patient reports that pain has improved with GI cocktail.   Assessment and Plan   1. Pregnancy related pelvic pain, antepartum, second trimester  DC home Comfort measures reviewed  3rd Trimester precautions  Bleeding precautions PTL precautions  Fetal kick counts RX: none  Return to MAU as needed FU with OB as planned  Follow-up Information    Follow up with Kathreen Cosier, MD.   Specialty:  Obstetrics and Gynecology   Why:  As scheduled   Contact information:   [address] [place] [address] [phone]         Tawnya Crook 03/11/2015,  10:19 PM

## 2015-03-12 ENCOUNTER — Encounter (HOSPITAL_COMMUNITY): Payer: Self-pay

## 2015-03-12 ENCOUNTER — Inpatient Hospital Stay (HOSPITAL_COMMUNITY)
Admission: AD | Admit: 2015-03-12 | Discharge: 2015-03-12 | Disposition: A | Discharge status: Home / Self Care | Payer: Self-pay | Source: Ambulatory Visit | Attending: Obstetrics | Admitting: Obstetrics

## 2015-03-12 DIAGNOSIS — B3731 Acute candidiasis of vulva and vagina: Secondary | ICD-10-CM

## 2015-03-12 DIAGNOSIS — O98812 Other maternal infectious and parasitic diseases complicating pregnancy, second trimester: Secondary | ICD-10-CM | POA: Insufficient documentation

## 2015-03-12 DIAGNOSIS — Z3A27 27 weeks gestation of pregnancy: Secondary | ICD-10-CM | POA: Insufficient documentation

## 2015-03-12 DIAGNOSIS — N949 Unspecified condition associated with female genital organs and menstrual cycle: Secondary | ICD-10-CM

## 2015-03-12 DIAGNOSIS — B373 Candidiasis of vulva and vagina: Secondary | ICD-10-CM | POA: Insufficient documentation

## 2015-03-12 DIAGNOSIS — R102 Pelvic and perineal pain: Secondary | ICD-10-CM | POA: Insufficient documentation

## 2015-03-12 LAB — URINALYSIS, ROUTINE W REFLEX MICROSCOPIC
BILIRUBIN URINE: NEGATIVE
Glucose, UA: NEGATIVE mg/dL
Hgb urine dipstick: NEGATIVE
KETONES UR: 15 mg/dL — AB
NITRITE: NEGATIVE
PROTEIN: NEGATIVE mg/dL
Specific Gravity, Urine: 1.025 (ref 1.005–1.030)
UROBILINOGEN UA: 1 mg/dL (ref 0.0–1.0)
pH: 6 (ref 5.0–8.0)

## 2015-03-12 LAB — URINE MICROSCOPIC-ADD ON

## 2015-03-12 LAB — WET PREP, GENITAL
CLUE CELLS WET PREP: NONE SEEN
TRICH WET PREP: NONE SEEN
Yeast Wet Prep HPF POC: NONE SEEN

## 2015-03-12 MED ORDER — IBUPROFEN 600 MG PO TABS
600.0000 mg | ORAL_TABLET | Freq: Once | ORAL | Status: AC
  Administered 2015-03-12: 600 mg via ORAL
  Filled 2015-03-12: qty 1

## 2015-03-12 MED ORDER — MICONAZOLE NITRATE 100 MG VA SUPP: 100.0000 mg | Freq: Every day | VAGINAL | Status: DC

## 2015-03-12 NOTE — Discharge Instructions (Signed)
Candidal Vulvovaginitis Candidal vulvovaginitis is an infection of the vagina and vulva. The vulva is the skin around the opening of the vagina. This may cause itching and discomfort in and around the vagina.  HOME CARE  Only take medicine as told by your doctor.  Do not have sex (intercourse) until the infection is healed or as told by your doctor.  Practice safe sex.  Tell your sex partner about your infection.  Do not douche or use tampons.  Wear cotton underwear. Do not wear tight pants or panty hose.  Eat yogurt. This may help treat and prevent yeast infections. GET HELP RIGHT AWAY IF:   You have a fever.  Your problems get worse during treatment or do not get better in 3 days.  You have discomfort, irritation, or itching in your vagina or vulva area.  You have pain after sex.  You start to get belly (abdominal) pain. MAKE SURE YOU:  Understand these instructions.  Will watch your condition.  Will get help right away if you are not doing well or get worse. Document Released: 10/21/2008 Document Revised: 07/30/2013 Document Reviewed: 10/21/2008 [facility] Patient Information 2015 [place], [place]. This information is not intended to replace advice given to you by your health care provider. Make sure you discuss any questions you have with your health care provider.  Round Ligament Pain During Pregnancy   Round ligament pain is a sharp pain or jabbing feeling often felt in the lower belly or groin area on one or both sides. It is one of the most common complaints during pregnancy and is considered a normal part of pregnancy. It is most often felt during the second trimester.   Here is what you need to know about round ligament pain, including some tips to help you feel better.   Causes of Round Ligament Pain   Several thick ligaments surround and support your womb (uterus) as it grows during pregnancy. One of them is called the round ligament.   The round ligament  connects the front part of the womb to your groin, the area where your legs attach to your pelvis. The round ligament normally tightens and relaxes slowly.   As your baby and womb grow, the round ligament stretches. That makes it more likely to become strained.   Sudden movements can cause the ligament to tighten quickly, like a rubber band snapping. This causes a sudden and quick jabbing feeling.   Symptoms of Round Ligament Pain   Round ligament pain can be concerning and uncomfortable. But it is considered normal as your body changes during pregnancy.   The symptoms of round ligament pain include a sharp, sudden spasm in the belly. It usually affects the right side, but it may happen on both sides. The pain only lasts a few seconds.   Exercise may cause the pain, as will rapid movements such as:  sneezing  coughing  laughing  rolling over in bed  standing up too quickly   Treatment of Round Ligament Pain   Here are some tips that may help reduce your discomfort:   Pain relief. Take over-the-counter acetaminophen for pain, if necessary. Ask your doctor if this is OK.   Exercise. Get plenty of exercise to keep your stomach (core) muscles strong. Doing stretching exercises or prenatal yoga can be helpful. Ask your doctor which exercises are safe for you and your baby.   A helpful exercise involves putting your hands and knees on the floor, lowering your head, and  pushing your backside into the air.   Avoid sudden movements. Change positions slowly (such as standing up or sitting down) to avoid sudden movements that may cause stretching and pain.   Flex your hips. Bend and flex your hips before you cough, sneeze, or laugh to avoid pulling on the ligaments.   Apply warmth. A heating pad or warm bath may be helpful. Ask your doctor if this is OK. Extreme heat can be dangerous to the baby.   You should try to modify your daily activity level and avoid positions that may worsen the  condition.   When to Call the Doctor/Midwife   Always tell your doctor or midwife about any type of pain you have during pregnancy. Round ligament pain is quick and doesn't last long.   Call your health care provider immediately if you have:  severe pain  fever  chills  pain on urination  difficulty walking   Belly pain during pregnancy can be due to many different causes. It is important for your doctor to rule out more serious conditions, including pregnancy complications such as placenta abruption or non-pregnancy illnesses such as:  inguinal hernia  appendicitis  stomach, liver, and kidney problems  Preterm labor pains may sometimes be mistaken for round ligament pain.

## 2015-03-12 NOTE — MAU Note (Signed)
Pt not in lobby.  

## 2015-03-12 NOTE — MAU Note (Signed)
Pt c/o abdominal pain that started last night. It hurts more when she gets up and moves around. Pt did not take any medications or try heat or ice for the pain. States that she feels a little pressure as well. Also noticed some mucus last night with some green discharge. Does not have an odor or does not itch.

## 2015-03-12 NOTE — MAU Provider Note (Signed)
Chief Complaint:  Abdominal Pain   First Provider Initiated Contact with Patient 03/12/15 1609     HPI: Natalie Morgan is a 24 y.o. G3P2002 at [redacted]w[redacted]d who presents to maternity admissions reporting bilat low abd pain since last night that is worse w/ mvmt.and green vaginal discharge. Seen in MAU last night for abd pain. CL by [place] 4.4 cm. Did not have spec exam due to previous Dx of LL placenta that has resolved.   Rates pain 4/10 on pain scale. Feels like pulling and pressure. Pain improves w/ rest. Hasn't tried any other Tx. No associated Sx. Neg for fever, chills, urinary complaints, GI complaints, VB or LOF. Good fetal movement.   Pregnancy Course: Uncomplicated  Past Medical History: Past Medical History  Diagnosis Date  . Recurrent upper respiratory infection (URI)   . Asthma     no meds    Past obstetric history: OB History  Gravida Para Term Preterm AB SAB TAB Ectopic Multiple Living  # Outcome Date GA Lbr Len/2nd Weight Sex Delivery Anes PTL Lv  3 Current           2 Term 11/01/13 [redacted]w[redacted]d 00:35 / 00:24 8 lb 13.4 oz (4.009 kg) F Vag-Spont Other  Y  1 Term 10/25/09 [redacted]w[redacted]d  7 lb 14 oz (3.572 kg) M Vag-Spont   Y      Past Surgical History: Past Surgical History  Procedure Laterality Date  . Toenail excision       Family History: Family History  Problem Relation Age of Onset  . Cancer Maternal Aunt   . Hypertension Mother     Social History: History  Substance Use Topics  . Smoking status: Never Smoker   . Smokeless tobacco: Not on file  . Alcohol Use: No    Allergies: No Known Allergies  Meds:  Prescriptions prior to admission  Medication Sig Dispense Refill Last Dose  . OVER THE COUNTER MEDICATION Take 1 packet by mouth daily. "Starbein", a supplement that is added to water. Includes folic acid.   03/11/2015 at Unknown time  . Prenatal Multivit-Min-Fe-FA (PRENATAL VITAMINS PO) Take 1 tablet by mouth daily.   03/11/2015 at Unknown time  .  albuterol (PROVENTIL HFA;VENTOLIN HFA) 108 (90 BASE) MCG/ACT inhaler Inhale 2 puffs into the lungs every 6 (six) hours as needed for wheezing or shortness of breath.   rescue at Unknown time    ROS:  Review of Systems  Constitutional: Negative for fever and chills.  Gastrointestinal: Positive for abdominal pain. Negative for nausea, vomiting, diarrhea and constipation.  Genitourinary: Negative for dysuria, urgency, frequency, hematuria and flank pain.       Neg for VB, LOF  Musculoskeletal: Negative for back pain.    Physical Exam  Blood pressure 109/59, pulse 102, temperature 98.2 F (36.8 C), temperature source Oral, resp. rate 18, currently breastfeeding. GENERAL: Well-developed, well-nourished female in no acute distress.  HEART: normal rate RESP: normal effort GI: Abd soft, mild bilat low abd/groin TTP, gravid appropriate for gestational age. Pos BS x 4 MS: Extremities nontender, no edema, normal ROM NEURO: Alert and oriented x 4.  GU: NEFG, moderate amount of thick, yellowish-white, odorless discharge, no blood, cervix clean. No CVAT Dilation: Closed Effacement (%): Thick Exam by:: Ivonne Andrew CNM  FHT:  Baseline 150 , moderate variability, accelerations present, no decelerations Contractions: none   Labs: Results for orders placed or performed during the hospital encounter  of 03/12/15 (from the past 24 hour(s))  Urinalysis, Routine w reflex microscopic (not at [facility])     Status: Abnormal   Collection Time: 03/12/15  3:40 PM  Result Value Ref Range   Color, Urine AMBER (A) YELLOW   APPearance CLEAR CLEAR   Specific Gravity, Urine 1.025 1.005 - 1.030   pH 6.0 5.0 - 8.0   Glucose, UA NEGATIVE NEGATIVE mg/dL   Hgb urine dipstick NEGATIVE NEGATIVE   Bilirubin Urine NEGATIVE NEGATIVE   Ketones, ur 15 (A) NEGATIVE mg/dL   Protein, ur NEGATIVE NEGATIVE mg/dL   Urobilinogen, UA 1.0 0.0 - 1.0 mg/dL   Nitrite NEGATIVE NEGATIVE   Leukocytes, UA SMALL (A) NEGATIVE  Urine  microscopic-add on     Status: Abnormal   Collection Time: 03/12/15  3:40 PM  Result Value Ref Range   Squamous Epithelial / LPF FEW (A) RARE   WBC, UA 7-10 <3 WBC/hpf   RBC / HPF 0-2 <3 RBC/hpf   Bacteria, UA FEW (A) RARE   Urine-Other MUCOUS PRESENT   Wet prep, genital     Status: Abnormal   Collection Time: 03/12/15  4:20 PM  Result Value Ref Range   Yeast Wet Prep HPF POC NONE SEEN NONE SEEN   Trich, Wet Prep NONE SEEN NONE SEEN   Clue Cells Wet Prep HPF POC NONE SEEN NONE SEEN   WBC, Wet Prep HPF POC MANY (A) NONE SEEN    Imaging:  NA  MAU Course: Spec exam, Wet Prep, GC/Chlamydia, Ibuprofen.  MDM Pain resolved. No cervical change. Pain C/W round ligament pain, not PTL. Discharge C/W VVC although wet prep neg. Will Tx for VVC since pt symptomatic..   Assessment: 1. Vaginal yeast infection   2. Round ligament pain    Plan: Discharge home in stable condition.  Preterm labor precautions and fetal kick counts Comfort measures Ibuprofen sparingly for pain. Do not use third trimester.  Follow-up Information    Follow up with MARSHALL,BERNARD A, MD In 1 week.   Specialty:  Obstetrics and Gynecology   Why:  Routine prenatal visit   Contact information:   [address] [place] [address] [phone]       Follow up with [facility] [place] [facility].   Why:  As needed in emergencies   Contact information:   [address] [id number] mc [place] [address] [phone]       Medication List    TAKE these medications        albuterol 108 (90 BASE) MCG/ACT inhaler  Commonly known as:  PROVENTIL HFA;VENTOLIN HFA  Inhale 2 puffs into the lungs every 6 (six) hours as needed for wheezing or shortness of breath.     miconazole 100 MG vaginal suppository  Commonly known as:  MICOTIN  Place 1 suppository (100 mg total) vaginally at bedtime.     OVER THE COUNTER MEDICATION  Take 1 packet by mouth daily.  "Starbein", a supplement that is added to water. Includes folic acid.     PRENATAL VITAMINS PO  Take 1 tablet by mouth daily.        [place], [place] 03/12/2015 5:33 PM

## 2015-03-13 LAB — GC/CHLAMYDIA PROBE AMP (~~LOC~~) NOT AT ARMC
Chlamydia: NEGATIVE
NEISSERIA GONORRHEA: NEGATIVE

## 2015-03-23 ENCOUNTER — Encounter: Payer: Self-pay | Admitting: *Deleted

## 2015-03-25 ENCOUNTER — Ambulatory Visit (INDEPENDENT_AMBULATORY_CARE_PROVIDER_SITE_OTHER): Payer: Self-pay | Admitting: Advanced Practice Midwife

## 2015-03-25 ENCOUNTER — Encounter: Payer: Self-pay | Admitting: Advanced Practice Midwife

## 2015-03-25 VITALS — BP 98/62 | HR 91 | Temp 98.0°F | Wt 195.5 lb

## 2015-03-25 DIAGNOSIS — Z3493 Encounter for supervision of normal pregnancy, unspecified, third trimester: Secondary | ICD-10-CM

## 2015-03-25 DIAGNOSIS — Z23 Encounter for immunization: Secondary | ICD-10-CM

## 2015-03-25 LAB — CBC
HCT: 34.4 % — ABNORMAL LOW (ref 36.0–46.0)
HEMOGLOBIN: 11.4 g/dL — AB (ref 12.0–15.0)
MCH: 28.3 pg (ref 26.0–34.0)
MCHC: 33.1 g/dL (ref 30.0–36.0)
MCV: 85.4 fL (ref 78.0–100.0)
MPV: 9.3 fL (ref 8.6–12.4)
Platelets: 226 10*3/uL (ref 150–400)
RBC: 4.03 MIL/uL (ref 3.87–5.11)
RDW: 14.3 % (ref 11.5–15.5)
WBC: 9 10*3/uL (ref 4.0–10.5)

## 2015-03-25 LAB — POCT URINALYSIS DIP (DEVICE)
BILIRUBIN URINE: NEGATIVE
Glucose, UA: NEGATIVE mg/dL
Ketones, ur: NEGATIVE mg/dL
Nitrite: NEGATIVE
PH: 6 (ref 5.0–8.0)
Protein, ur: NEGATIVE mg/dL
Specific Gravity, Urine: 1.025 (ref 1.005–1.030)
Urobilinogen, UA: 0.2 mg/dL (ref 0.0–1.0)

## 2015-03-25 MED ORDER — TETANUS-DIPHTH-ACELL PERTUSSIS 5-2.5-18.5 LF-MCG/0.5 IM SUSP
0.5000 mL | Freq: Once | INTRAMUSCULAR | Status: AC
  Administered 2015-03-25: 0.5 mL via INTRAMUSCULAR

## 2015-03-25 NOTE — Progress Notes (Signed)
   Subjective:    Natalie Morgan is a [id number] [redacted]w[redacted]d being seen today for her first obstetrical visit.  Her obstetrical history is significant for NSVD at term x 2, largest baby 9 lbs. Patient does intend to breast feed. Pregnancy history fully reviewed.  Patient reports no complaints.  Filed Vitals:   03/25/15 0802  BP: 98/62  Pulse: 91  Temp: 98 F (36.7 C)  Weight: 195 lb 8 oz (88.678 kg)    HISTORY: OB History  Gravida Para Term Preterm AB SAB TAB Ectopic Multiple Living  # Outcome Date GA Lbr Len/2nd Weight Sex Delivery Anes PTL Lv  3 Current           2 Term 11/01/13 [redacted]w[redacted]d 00:35 / 00:24 8 lb 13.4 oz (4.009 kg) F Vag-Spont Other  Y  1 Term 10/25/09 [redacted]w[redacted]d  7 lb 14 oz (3.572 kg) M Vag-Spont   Y     Past Medical History  Diagnosis Date  . Recurrent upper respiratory infection (URI)   . Asthma     no meds   Past Surgical History  Procedure Laterality Date  . Toenail excision     Family History  Problem Relation Age of Onset  . Cancer Maternal Aunt   . Hypertension Mother   . Diabetes Mother      Exam   FHT present by doppler  Uterus:     Pelvic Exam: Deferred, done by Dr Gaynell Face, records requested  System: Breast:  normal appearance, no masses or tenderness   Skin: normal coloration and turgor, no rashes    Neurologic: oriented, normal, gait normal; reflexes normal and symmetric   Extremities: normal strength, tone, and muscle mass, ROM of all joints is normal   HEENT sclera clear, anicteric   Mouth/Teeth mucous membranes moist, pharynx normal without lesions and dental hygiene good, pt wearing braces   Neck supple and no masses   Cardiovascular:    Respiratory:  appears well, vitals normal, no respiratory distress, acyanotic, normal RR, ear and throat exam is normal, neck free of mass or lymphadenopathy   Abdomen: soft, non-tender; bowel sounds normal; no masses,  no organomegaly   Urinary: not evaluated      Assessment:     Pregnancy: [id number] Patient Active Problem List   Diagnosis Date Noted  . Low lying placenta without hemorrhage, antepartum   . 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 Supervision of normal pregnancy 11/01/2013  . Hemorrhoid 12/21/2012  . ASTHMA, INTERMITTENT 08/13/2009        Plan:     Initial labs reviewed.  28 week labs done today. Prenatal vitamins. Problem list reviewed and updated. Genetic Screening discussed : results reviewed.  Ultrasound discussed; fetal survey: results reviewed.  Follow up in 2 weeks.    LEFTWICH-KIRBY, Clarinda Obi 03/25/2015

## 2015-03-25 NOTE — Progress Notes (Signed)
Some charting late due to EPIC down.

## 2015-03-25 NOTE — Progress Notes (Signed)
Natalie Morgan declines HIV test today, states she will get results from lab where she had it drawn as ordered by Dr. Gaynell Face.

## 2015-03-25 NOTE — Addendum Note (Signed)
Addended by: Gerome Apley on: 03/25/2015 09:39 AM   Modules accepted: Orders

## 2015-03-25 NOTE — Progress Notes (Signed)
Here for initial visit. Given new patient information.

## 2015-03-26 LAB — RPR

## 2015-03-26 LAB — GLUCOSE TOLERANCE, 1 HOUR (50G) W/O FASTING: Glucose, 1 Hour GTT: 113 mg/dL (ref 70–140)

## 2015-04-08 ENCOUNTER — Ambulatory Visit (INDEPENDENT_AMBULATORY_CARE_PROVIDER_SITE_OTHER): Payer: Self-pay | Admitting: Certified Nurse Midwife

## 2015-04-08 VITALS — BP 99/64 | HR 93 | Temp 98.2°F | Wt 198.9 lb

## 2015-04-08 DIAGNOSIS — Z3483 Encounter for supervision of other normal pregnancy, third trimester: Secondary | ICD-10-CM

## 2015-04-08 LAB — POCT URINALYSIS DIP (DEVICE)
Glucose, UA: 500 mg/dL — AB
Hgb urine dipstick: NEGATIVE
Leukocytes, UA: NEGATIVE
Nitrite: NEGATIVE
Protein, ur: NEGATIVE mg/dL
Specific Gravity, Urine: >=1.03 (ref 1.005–1.030)
Urobilinogen, UA: 0.2 mg/dL (ref 0.0–1.0)
pH: 6 (ref 5.0–8.0)

## 2015-04-08 NOTE — Progress Notes (Signed)
Edema- hands/feet    Pressure- feet

## 2015-04-08 NOTE — Patient Instructions (Signed)
Third Trimester of Pregnancy The third trimester is from week 29 through week 42, months 7 through 9. The third trimester is a time when the fetus is growing rapidly. At the end of the ninth month, the fetus is about 20 inches in length and weighs 6-10 pounds.  BODY CHANGES Your body goes through many changes during pregnancy. The changes vary from woman to woman.   Your weight will continue to increase. You can expect to gain 25-35 pounds (11-16 kg) by the end of the pregnancy.  You may begin to get stretch marks on your hips, abdomen, and breasts.  You may urinate more often because the fetus is moving lower into your pelvis and pressing on your bladder.  You may develop or continue to have heartburn as a result of your pregnancy.  You may develop constipation because certain hormones are causing the muscles that push waste through your intestines to slow down.  You may develop hemorrhoids or swollen, bulging veins (varicose veins).  You may have pelvic pain because of the weight gain and pregnancy hormones relaxing your joints between the bones in your pelvis. Backaches may result from overexertion of the muscles supporting your posture.  You may have changes in your hair. These can include thickening of your hair, rapid growth, and changes in texture. Some women also have hair loss during or after pregnancy, or hair that feels dry or thin. Your hair will most likely return to normal after your baby is born.  Your breasts will continue to grow and be tender. A yellow discharge may leak from your breasts called colostrum.  Your belly button may stick out.  You may feel short of breath because of your expanding uterus.  You may notice the fetus "dropping," or moving lower in your abdomen.  You may have a bloody mucus discharge. This usually occurs a few days to a week before labor begins.  Your cervix becomes thin and soft (effaced) near your due date. WHAT TO EXPECT AT YOUR PRENATAL  EXAMS  You will have prenatal exams every 2 weeks until week 36. Then, you will have weekly prenatal exams. During a routine prenatal visit:  You will be weighed to make sure you and the fetus are growing normally.  Your blood pressure is taken.  Your abdomen will be measured to track your baby's growth.  The fetal heartbeat will be listened to.  Any test results from the previous visit will be discussed.  You may have a cervical check near your due date to see if you have effaced. At around 36 weeks, your caregiver will check your cervix. At the same time, your caregiver will also perform a test on the secretions of the vaginal tissue. This test is to determine if a type of bacteria, Group B streptococcus, is present. Your caregiver will explain this further. Your caregiver may ask you:  What your birth plan is.  How you are feeling.  If you are feeling the baby move.  If you have had any abnormal symptoms, such as leaking fluid, bleeding, severe headaches, or abdominal cramping.  If you have any questions. Other tests or screenings that may be performed during your third trimester include:  Blood tests that check for low iron levels (anemia).  Fetal testing to check the health, activity level, and growth of the fetus. Testing is done if you have certain medical conditions or if there are problems during the pregnancy. FALSE LABOR You may feel small, irregular contractions that   eventually go away. These are called Braxton Hicks contractions, or false labor. Contractions may last for hours, days, or even weeks before true labor sets in. If contractions come at regular intervals, intensify, or become painful, it is best to be seen by your caregiver.  SIGNS OF LABOR   Menstrual-like cramps.  Contractions that are 5 minutes apart or less.  Contractions that start on the top of the uterus and spread down to the lower abdomen and back.  A sense of increased pelvic pressure or back  pain.  A watery or bloody mucus discharge that comes from the vagina. If you have any of these signs before the 37th week of pregnancy, call your caregiver right away. You need to go to the hospital to get checked immediately. HOME CARE INSTRUCTIONS   Avoid all smoking, herbs, alcohol, and unprescribed drugs. These chemicals affect the formation and growth of the baby.  Follow your caregiver's instructions regarding medicine use. There are medicines that are either safe or unsafe to take during pregnancy.  Exercise only as directed by your caregiver. Experiencing uterine cramps is a good sign to stop exercising.  Continue to eat regular, healthy meals.  Wear a good support bra for breast tenderness.  Do not use hot tubs, steam rooms, or saunas.  Wear your seat belt at all times when driving.  Avoid raw meat, uncooked cheese, cat litter boxes, and soil used by cats. These carry germs that can cause birth defects in the baby.  Take your prenatal vitamins.  Try taking a stool softener (if your caregiver approves) if you develop constipation. Eat more high-fiber foods, such as fresh vegetables or fruit and whole grains. Drink plenty of fluids to keep your urine clear or pale yellow.  Take warm sitz baths to soothe any pain or discomfort caused by hemorrhoids. Use hemorrhoid cream if your caregiver approves.  If you develop varicose veins, wear support hose. Elevate your feet for 15 minutes, 3-4 times a day. Limit salt in your diet.  Avoid heavy lifting, wear low heal shoes, and practice good posture.  Rest a lot with your legs elevated if you have leg cramps or low back pain.  Visit your dentist if you have not gone during your pregnancy. Use a soft toothbrush to brush your teeth and be gentle when you floss.  A sexual relationship may be continued unless your caregiver directs you otherwise.  Do not travel far distances unless it is absolutely necessary and only with the approval  of your caregiver.  Take prenatal classes to understand, practice, and ask questions about the labor and delivery.  Make a trial run to the hospital.  Pack your hospital bag.  Prepare the baby's nursery.  Continue to go to all your prenatal visits as directed by your caregiver. SEEK MEDICAL CARE IF:  You are unsure if you are in labor or if your water has broken.  You have dizziness.  You have mild pelvic cramps, pelvic pressure, or nagging pain in your abdominal area.  You have persistent nausea, vomiting, or diarrhea.  You have a bad smelling vaginal discharge.  You have pain with urination. SEEK IMMEDIATE MEDICAL CARE IF:   You have a fever.  You are leaking fluid from your vagina.  You have spotting or bleeding from your vagina.  You have severe abdominal cramping or pain.  You have rapid weight loss or gain.  You have shortness of breath with chest pain.  You notice sudden or extreme swelling   of your face, hands, ankles, feet, or legs.  You have not felt your baby move in over an hour.  You have severe headaches that do not go away with medicine.  You have vision changes. Document Released: 07/19/2001 Document Revised: 07/30/2013 Document Reviewed: 09/25/2012 ExitCare Patient Information 2015 [employer]. This information is not intended to replace advice given to you by your health care provider. Make sure you discuss any questions you have with your health care provider.  

## 2015-04-08 NOTE — Progress Notes (Signed)
  Subjective:  Natalie Morgan is a 24 y.o. G3P2002 at [redacted]w[redacted]d being seen today for ongoing prenatal care.  Patient reports no complaints.  Contractions: Irritability.  Vag. Bleeding: None. Movement: Present. Denies leaking of fluid.   The following portions of the patient's history were reviewed and updated as appropriate: allergies, current medications, past family history, past medical history, past social history, past surgical history and problem list. Pt is interested in waterbirth. Waterbirth contraindications discussed along with her taking the waterbirth class. Perinatal Education phone number given to pt  Objective:   Filed Vitals:   04/08/15 0951  BP: 99/64  Pulse: 93  Temp: 98.2 F (36.8 C)  Weight: 198 lb 14.4 oz (90.22 kg)    Fetal Status: Fetal Heart Rate (bpm): 145   Movement: Present     General:  Alert, oriented and cooperative. Patient is in no acute distress.  Skin: Skin is warm and dry. No rash noted.   Cardiovascular: Normal heart rate noted  Respiratory: Normal respiratory effort, no problems with respiration noted  Abdomen: Soft, gravid, appropriate for gestational age. Pain/Pressure: Present     Pelvic: Vag. Bleeding: None     Cervical exam deferred        Extremities: Normal range of motion.  Edema: Trace  Mental Status: Normal mood and affect. Normal behavior. Normal judgment and thought content.   Urinalysis:      Assessment and Plan:  Pregnancy: G3P2002 at [redacted]w[redacted]d   Preterm labor symptoms and general obstetric precautions including but not limited to vaginal bleeding, contractions, leaking of fluid and fetal movement were reviewed in detail with the patient. Please refer to After Visit Summary for other counseling recommendations.  Return in about 2 weeks (around 04/22/2015).   Rhea Pink, CNM

## 2015-04-22 ENCOUNTER — Ambulatory Visit (INDEPENDENT_AMBULATORY_CARE_PROVIDER_SITE_OTHER): Payer: Self-pay | Admitting: Family Medicine

## 2015-04-22 VITALS — BP 96/56 | HR 93 | Temp 98.8°F | Wt 203.3 lb

## 2015-04-22 DIAGNOSIS — Z23 Encounter for immunization: Secondary | ICD-10-CM

## 2015-04-22 DIAGNOSIS — Z3493 Encounter for supervision of normal pregnancy, unspecified, third trimester: Secondary | ICD-10-CM

## 2015-04-22 DIAGNOSIS — O4403 Placenta previa specified as without hemorrhage, third trimester: Secondary | ICD-10-CM

## 2015-04-22 LAB — POCT URINALYSIS DIP (DEVICE)
Bilirubin Urine: NEGATIVE
Glucose, UA: 100 mg/dL — AB
HGB URINE DIPSTICK: NEGATIVE
Ketones, ur: NEGATIVE mg/dL
Leukocytes, UA: NEGATIVE
NITRITE: NEGATIVE
PH: 7 (ref 5.0–8.0)
PROTEIN: NEGATIVE mg/dL
SPECIFIC GRAVITY, URINE: 1.02 (ref 1.005–1.030)
UROBILINOGEN UA: 0.2 mg/dL (ref 0.0–1.0)

## 2015-04-22 NOTE — Progress Notes (Signed)
Subjective:  Natalie Morgan is a 24 y.o. G3P2002 at [redacted]w[redacted]d being seen today for ongoing prenatal care.  Patient reports no complaints.  Contractions: Not present.  Vag. Bleeding: None. Movement: Present. Denies leaking of fluid.   The following portions of the patient's history were reviewed and updated as appropriate: allergies, current medications, past family history, past medical history, past social history, past surgical history and problem list.   Objective:   Filed Vitals:   04/22/15 1015  BP: 96/56  Pulse: 93  Temp: 98.8 F (37.1 C)  Weight: 203 lb 4.8 oz (92.216 kg)    Fetal Status: Fetal Heart Rate (bpm): 148   Movement: Present     General:  Alert, oriented and cooperative. Patient is in no acute distress.  Skin: Skin is warm and dry. No rash noted.   Cardiovascular: Normal heart rate noted  Respiratory: Normal respiratory effort, no problems with respiration noted  Abdomen: Soft, gravid, appropriate for gestational age. Pain/Pressure: Present     Pelvic: Vag. Bleeding: None     Cervical exam deferred        Extremities: Normal range of motion.  Edema: None  Mental Status: Normal mood and affect. Normal behavior. Normal judgment and thought content.   Urinalysis: Urine Protein: Negative Urine Glucose: 1+  Assessment and Plan:  Pregnancy: G3P2002 at [redacted]w[redacted]d  1. Supervision of normal pregnancy, third trimester Updated box Negative HIV test per pt report. She will bring result next visit Discussed Mirena at length, she is interested but is self pay and will discuss with financial counselors. Possibly will need ordered from company  2. Low lying placenta without hemorrhage, antepartum, third trimester Resolved on [place]  Preterm labor symptoms and general obstetric precautions including but not limited to vaginal bleeding, contractions, leaking of fluid and fetal movement were reviewed in detail with the patient. Please refer to After Visit Summary for other  counseling recommendations.  Return in about 2 weeks (around 05/06/2015) for Routine prenatal care.   Federico Flake, MD

## 2015-04-22 NOTE — Patient Instructions (Signed)
Breastfeeding Deciding to breastfeed is one of the best choices you can make for you and your baby. A change in hormones during pregnancy causes your breast tissue to grow and increases the number and size of your milk ducts. These hormones also allow proteins, sugars, and fats from your blood supply to make breast milk in your milk-producing glands. Hormones prevent breast milk from being released before your baby is born as well as prompt milk flow after birth. Once breastfeeding has begun, thoughts of your baby, as well as his or her sucking or crying, can stimulate the release of milk from your milk-producing glands.  BENEFITS OF BREASTFEEDING For Your Baby  Your first milk (colostrum) helps your baby's digestive system function better.   There are antibodies in your milk that help your baby fight off infections.   Your baby has a lower incidence of asthma, allergies, and sudden infant death syndrome.   The nutrients in breast milk are better for your baby than infant formulas and are designed uniquely for your baby's needs.   Breast milk improves your baby's brain development.   Your baby is less likely to develop other conditions, such as childhood obesity, asthma, or type 2 diabetes mellitus.  For You   Breastfeeding helps to create a very special bond between you and your baby.   Breastfeeding is convenient. Breast milk is always available at the correct temperature and costs nothing.   Breastfeeding helps to burn calories and helps you lose the weight gained during pregnancy.   Breastfeeding makes your uterus contract to its prepregnancy size faster and slows bleeding (lochia) after you give birth.   Breastfeeding helps to lower your risk of developing type 2 diabetes mellitus, osteoporosis, and breast or ovarian cancer later in life. SIGNS THAT YOUR BABY IS HUNGRY Early Signs of Hunger  Increased alertness or activity.  Stretching.  Movement of the head from  side to side.  Movement of the head and opening of the mouth when the corner of the mouth or cheek is stroked (rooting).  Increased sucking sounds, smacking lips, cooing, sighing, or squeaking.  Hand-to-mouth movements.  Increased sucking of fingers or hands. Late Signs of Hunger  Fussing.  Intermittent crying. Extreme Signs of Hunger Signs of extreme hunger will require calming and consoling before your baby will be able to breastfeed successfully. Do not wait for the following signs of extreme hunger to occur before you initiate breastfeeding:   Restlessness.  A loud, strong cry.   Screaming. BREASTFEEDING BASICS Breastfeeding Initiation  Find a comfortable place to sit or lie down, with your neck and back well supported.  Place a pillow or rolled up blanket under your baby to bring him or her to the level of your breast (if you are seated). Nursing pillows are specially designed to help support your arms and your baby while you breastfeed.  Make sure that your baby's abdomen is facing your abdomen.   Gently massage your breast. With your fingertips, massage from your chest wall toward your nipple in a circular motion. This encourages milk flow. You may need to continue this action during the feeding if your milk flows slowly.  Support your breast with 4 fingers underneath and your thumb above your nipple. Make sure your fingers are well away from your nipple and your baby's mouth.   Stroke your baby's lips gently with your finger or nipple.   When your baby's mouth is open wide enough, quickly bring your baby to your   breast, placing your entire nipple and as much of the colored area around your nipple (areola) as possible into your baby's mouth.   More areola should be visible above your baby's upper lip than below the lower lip.   Your baby's tongue should be between his or her lower gum and your breast.   Ensure that your baby's mouth is correctly positioned  around your nipple (latched). Your baby's lips should create a seal on your breast and be turned out (everted).  It is common for your baby to suck about 2-3 minutes in order to start the flow of breast milk. Latching Teaching your baby how to latch on to your breast properly is very important. An improper latch can cause nipple pain and decreased milk supply for you and poor weight gain in your baby. Also, if your baby is not latched onto your nipple properly, he or she may swallow some air during feeding. This can make your baby fussy. Burping your baby when you switch breasts during the feeding can help to get rid of the air. However, teaching your baby to latch on properly is still the best way to prevent fussiness from swallowing air while breastfeeding. Signs that your baby has successfully latched on to your nipple:    Silent tugging or silent sucking, without causing you pain.   Swallowing heard between every 3-4 sucks.    Muscle movement above and in front of his or her ears while sucking.  Signs that your baby has not successfully latched on to nipple:   Sucking sounds or smacking sounds from your baby while breastfeeding.  Nipple pain. If you think your baby has not latched on correctly, slip your finger into the corner of your baby's mouth to break the suction and place it between your baby's gums. Attempt breastfeeding initiation again. Signs of Successful Breastfeeding Signs from your baby:   A gradual decrease in the number of sucks or complete cessation of sucking.   Falling asleep.   Relaxation of his or her body.   Retention of a small amount of milk in his or her mouth.   Letting go of your breast by himself or herself. Signs from you:  Breasts that have increased in firmness, weight, and size 1-3 hours after feeding.   Breasts that are softer immediately after breastfeeding.  Increased milk volume, as well as a change in milk consistency and color by  the fifth day of breastfeeding.   Nipples that are not sore, cracked, or bleeding. Signs That Your Baby is Getting Enough Milk  Wetting at least 3 diapers in a 24-hour period. The urine should be clear and pale yellow by age 5 days.  At least 3 stools in a 24-hour period by age 5 days. The stool should be soft and yellow.  At least 3 stools in a 24-hour period by age 7 days. The stool should be seedy and yellow.  No loss of weight greater than 10% of birth weight during the first 3 days of age.  Average weight gain of 4-7 ounces (113-198 g) per week after age 4 days.  Consistent daily weight gain by age 5 days, without weight loss after the age of 2 weeks. After a feeding, your baby may spit up a small amount. This is common. BREASTFEEDING FREQUENCY AND DURATION Frequent feeding will help you make more milk and can prevent sore nipples and breast engorgement. Breastfeed when you feel the need to reduce the fullness of your breasts   or when your baby shows signs of hunger. This is called "breastfeeding on demand." Avoid introducing a pacifier to your baby while you are working to establish breastfeeding (the first 4-6 weeks after your baby is born). After this time you may choose to use a pacifier. Research has shown that pacifier use during the first year of a baby's life decreases the risk of sudden infant death syndrome (SIDS). Allow your baby to feed on each breast as long as he or she wants. Breastfeed until your baby is finished feeding. When your baby unlatches or falls asleep while feeding from the first breast, offer the second breast. Because newborns are often sleepy in the first few weeks of life, you may need to awaken your baby to get him or her to feed. Breastfeeding times will vary from baby to baby. However, the following rules can serve as a guide to help you ensure that your baby is properly fed:  Newborns (babies 4 weeks of age or younger) may breastfeed every 1-3  hours.  Newborns should not go longer than 3 hours during the day or 5 hours during the night without breastfeeding.  You should breastfeed your baby a minimum of 8 times in a 24-hour period until you begin to introduce solid foods to your baby at around 6 months of age. BREAST MILK PUMPING Pumping and storing breast milk allows you to ensure that your baby is exclusively fed your breast milk, even at times when you are unable to breastfeed. This is especially important if you are going back to work while you are still breastfeeding or when you are not able to be present during feedings. Your lactation consultant can give you guidelines on how long it is safe to store breast milk.  A breast pump is a machine that allows you to pump milk from your breast into a sterile bottle. The pumped breast milk can then be stored in a refrigerator or freezer. Some breast pumps are operated by hand, while others use electricity. Ask your lactation consultant which type will work best for you. Breast pumps can be purchased, but some hospitals and breastfeeding support groups lease breast pumps on a monthly basis. A lactation consultant can teach you how to hand express breast milk, if you prefer not to use a pump.  CARING FOR YOUR BREASTS WHILE YOU BREASTFEED Nipples can become dry, cracked, and sore while breastfeeding. The following recommendations can help keep your breasts moisturized and healthy:  Avoid using soap on your nipples.   Wear a supportive bra. Although not required, special nursing bras and tank tops are designed to allow access to your breasts for breastfeeding without taking off your entire bra or top. Avoid wearing underwire-style bras or extremely tight bras.  Air dry your nipples for 3-4minutes after each feeding.   Use only cotton bra pads to absorb leaked breast milk. Leaking of breast milk between feedings is normal.   Use lanolin on your nipples after breastfeeding. Lanolin helps to  maintain your skin's normal moisture barrier. If you use pure lanolin, you do not need to wash it off before feeding your baby again. Pure lanolin is not toxic to your baby. You may also hand express a few drops of breast milk and gently massage that milk into your nipples and allow the milk to air dry. In the first few weeks after giving birth, some women experience extremely full breasts (engorgement). Engorgement can make your breasts feel heavy, warm, and tender to the   touch. Engorgement peaks within 3-5 days after you give birth. The following recommendations can help ease engorgement:  Completely empty your breasts while breastfeeding or pumping. You may want to start by applying warm, moist heat (in the shower or with warm water-soaked hand towels) just before feeding or pumping. This increases circulation and helps the milk flow. If your baby does not completely empty your breasts while breastfeeding, pump any extra milk after he or she is finished.  Wear a snug bra (nursing or regular) or tank top for 1-2 days to signal your body to slightly decrease milk production.  Apply ice packs to your breasts, unless this is too uncomfortable for you.  Make sure that your baby is latched on and positioned properly while breastfeeding. If engorgement persists after 48 hours of following these recommendations, contact your health care provider or a lactation consultant. OVERALL HEALTH CARE RECOMMENDATIONS WHILE BREASTFEEDING  Eat healthy foods. Alternate between meals and snacks, eating 3 of each per day. Because what you eat affects your breast milk, some of the foods may make your baby more irritable than usual. Avoid eating these foods if you are sure that they are negatively affecting your baby.  Drink milk, fruit juice, and water to satisfy your thirst (about 10 glasses a day).   Rest often, relax, and continue to take your prenatal vitamins to prevent fatigue, stress, and anemia.  Continue  breast self-awareness checks.  Avoid chewing and smoking tobacco.  Avoid alcohol and drug use. Some medicines that may be harmful to your baby can pass through breast milk. It is important to ask your health care provider before taking any medicine, including all over-the-counter and prescription medicine as well as vitamin and herbal supplements. It is possible to become pregnant while breastfeeding. If birth control is desired, ask your health care provider about options that will be safe for your baby. SEEK MEDICAL CARE IF:   You feel like you want to stop breastfeeding or have become frustrated with breastfeeding.  You have painful breasts or nipples.  Your nipples are cracked or bleeding.  Your breasts are red, tender, or warm.  You have a swollen area on either breast.  You have a fever or chills.  You have nausea or vomiting.  You have drainage other than breast milk from your nipples.  Your breasts do not become full before feedings by the fifth day after you give birth.  You feel sad and depressed.  Your baby is too sleepy to eat well.  Your baby is having trouble sleeping.   Your baby is wetting less than 3 diapers in a 24-hour period.  Your baby has less than 3 stools in a 24-hour period.  Your baby's skin or the white part of his or her eyes becomes yellow.   Your baby is not gaining weight by 5 days of age. SEEK IMMEDIATE MEDICAL CARE IF:   Your baby is overly tired (lethargic) and does not want to wake up and feed.  Your baby develops an unexplained fever. Document Released: 07/25/2005 Document Revised: 07/30/2013 Document Reviewed: 01/16/2013 [employer] Patient Information 2015 [employer]. This information is not intended to replace advice given to you by your health care provider. Make sure you discuss any questions you have with your health care provider.  

## 2015-04-22 NOTE — Progress Notes (Signed)
Breastfeeding tip of the week reviewed Flu today 

## 2015-05-06 ENCOUNTER — Ambulatory Visit (INDEPENDENT_AMBULATORY_CARE_PROVIDER_SITE_OTHER): Payer: Self-pay | Admitting: Advanced Practice Midwife

## 2015-05-06 VITALS — BP 101/59 | HR 89 | Temp 98.4°F | Wt 208.8 lb

## 2015-05-06 DIAGNOSIS — Z3493 Encounter for supervision of normal pregnancy, unspecified, third trimester: Secondary | ICD-10-CM

## 2015-05-06 LAB — POCT URINALYSIS DIP (DEVICE)
BILIRUBIN URINE: NEGATIVE
Glucose, UA: NEGATIVE mg/dL
HGB URINE DIPSTICK: NEGATIVE
KETONES UR: NEGATIVE mg/dL
Leukocytes, UA: NEGATIVE
Nitrite: NEGATIVE
PH: 7 (ref 5.0–8.0)
Protein, ur: NEGATIVE mg/dL
SPECIFIC GRAVITY, URINE: 1.025 (ref 1.005–1.030)
Urobilinogen, UA: 0.2 mg/dL (ref 0.0–1.0)

## 2015-05-06 NOTE — Progress Notes (Signed)
Patient brought it copy of HIV labs done outside of [facility].

## 2015-05-06 NOTE — Progress Notes (Signed)
Subjective:  Natalie Morgan is a 24 y.o. G3P2002 at [redacted]w[redacted]d being seen today for ongoing prenatal care.  Patient reports no complaints.  Contractions: Not present.  Vag. Bleeding: None. Movement: Present. Denies leaking of fluid.   The following portions of the patient's history were reviewed and updated as appropriate: allergies, current medications, past family history, past medical history, past social history, past surgical history and problem list.   Objective:   Filed Vitals:   05/06/15 1110  BP: 101/59  Pulse: 89  Temp: 98.4 F (36.9 C)  Weight: 208 lb 12.8 oz (94.711 kg)    Fetal Status: Fetal Heart Rate (bpm): 144 Fundal Height: 35 cm Movement: Present     General:  Alert, oriented and cooperative. Patient is in no acute distress.  Skin: Skin is warm and dry. No rash noted.   Cardiovascular: Normal heart rate noted  Respiratory: Normal respiratory effort, no problems with respiration noted  Abdomen: Soft, gravid, appropriate for gestational age. Pain/Pressure: Absent     Pelvic: Vag. Bleeding: None     Cervical exam deferred        Extremities: Normal range of motion.  Edema: Trace  Mental Status: Normal mood and affect. Normal behavior. Normal judgment and thought content.   Urinalysis: Urine Protein: Negative Urine Glucose: Negative  Assessment and Plan:  Pregnancy: G3P2002 at [redacted]w[redacted]d  There are no diagnoses linked to this encounter. Preterm labor symptoms and general obstetric precautions including but not limited to vaginal bleeding, contractions, leaking of fluid and fetal movement were reviewed in detail with the patient.  Discussed contraceptive choices with pt, questions answered about IUDs and Nexplanon.  Pt to fill out scholarship for Mirena in office today.    Please refer to After Visit Summary for other counseling recommendations.  Return in about 2 weeks (around 05/20/2015).   Hurshel Party, CNM

## 2015-05-20 ENCOUNTER — Ambulatory Visit (INDEPENDENT_AMBULATORY_CARE_PROVIDER_SITE_OTHER): Payer: Self-pay | Admitting: Certified Nurse Midwife

## 2015-05-20 VITALS — BP 100/57 | HR 104 | Temp 98.8°F | Wt 211.9 lb

## 2015-05-20 DIAGNOSIS — Z3493 Encounter for supervision of normal pregnancy, unspecified, third trimester: Secondary | ICD-10-CM

## 2015-05-20 DIAGNOSIS — Z113 Encounter for screening for infections with a predominantly sexual mode of transmission: Secondary | ICD-10-CM

## 2015-05-20 LAB — POCT URINALYSIS DIP (DEVICE)
BILIRUBIN URINE: NEGATIVE
GLUCOSE, UA: 100 mg/dL — AB
KETONES UR: NEGATIVE mg/dL
Nitrite: NEGATIVE
PROTEIN: NEGATIVE mg/dL
Urobilinogen, UA: 0.2 mg/dL (ref 0.0–1.0)
pH: 7 (ref 5.0–8.0)

## 2015-05-20 LAB — OB RESULTS CONSOLE GBS: STREP GROUP B AG: NEGATIVE

## 2015-05-20 LAB — OB RESULTS CONSOLE GC/CHLAMYDIA: Gonorrhea: NEGATIVE

## 2015-05-20 NOTE — Progress Notes (Signed)
Subjective:  Natalie Morgan is a 24 y.o. G3P2002 at 7762w6d being seen today for ongoing prenatal care.  Patient reports no complaints.  Contractions: Irregular.  Vag. Bleeding: None. Movement: Present. Denies leaking of fluid.   The following portions of the patient's history were reviewed and updated as appropriate: allergies, current medications, past family history, past medical history, past social history, past surgical history and problem list. Problem list updated.  Objective:   Filed Vitals:   05/20/15 1104  BP: 100/57  Pulse: 104  Temp: 98.8 F (37.1 C)  Weight: 211 lb 14.4 oz (96.117 kg)    Fetal Status: Fetal Heart Rate (bpm): 157   Movement: Present     General:  Alert, oriented and cooperative. Patient is in no acute distress.  Skin: Skin is warm and dry. No rash noted.   Cardiovascular: Normal heart rate noted  Respiratory: Normal respiratory effort, no problems with respiration noted  Abdomen: Soft, gravid, appropriate for gestational age. Pain/Pressure: Absent     Pelvic: Vag. Bleeding: None     Cervical exam performed        Extremities: Normal range of motion.  Edema: Trace  Mental Status: Normal mood and affect. Normal behavior. Normal judgment and thought content.   Urinalysis: Urine Protein: Negative Urine Glucose: 1+  Assessment and Plan:  Pregnancy: G3P2002 at 762w6d  1. Prenatal care in third trimester  - Culture, beta strep (group b only) - GC/Chlamydia probe amp ([place]not at [facility]  Term labor symptoms and general obstetric precautions including but not limited to vaginal bleeding, contractions, leaking of fluid and fetal movement were reviewed in detail with the patient. Please refer to After Visit Summary for other counseling recommendations.  Return in about 1 week (around 05/27/2015).   Rhea PinkLori A Clemmons, CNM

## 2015-05-20 NOTE — Patient Instructions (Signed)
Group B streptococcus (GBS) is a type of bacteria often found in healthy women. GBS is not the same as the bacteria that causes strep throat. You may have GBS in your vagina, rectum, or bladder. GBS does not spread through sexual contact, but it can be passed to a baby during childbirth. This can be dangerous for your baby. It is not dangerous to you and usually does not cause any symptoms. Your health care provider may test you for GBS when your pregnancy is between 35 and 37 weeks. GBS is dangerous only during birth, so there is no need to test for it earlier. It is possible to have GBS during pregnancy and never pass it to your baby. If your test results are positive for GBS, your health care provider may recommend giving you antibiotic medicine during delivery to make sure your baby stays healthy. RISK FACTORS You are more likely to pass GBS to your baby if:   Your water breaks (ruptured membrane) or you go into labor before 37 weeks.  Your water breaks 18 hours before you deliver.  You passed GBS during a previous pregnancy.  You have a urinary tract infection caused by GBS any time during pregnancy.  You have a fever during labor. SYMPTOMS Most women who have GBS do not have any symptoms. If you have a urinary tract infection caused by GBS, you might have frequent or painful urination and fever. Babies who get GBS usually show symptoms within 7 days of birth. Symptoms may include:   Breathing problems.  Heart and blood pressure problems.  Digestive and kidney problems. DIAGNOSIS Routine screening for GBS is recommended for all pregnant women. A health care provider takes a sample of the fluid in your vagina and rectum with a swab. It is then sent to a lab to be checked for GBS. A sample of your urine may also be checked for the bacteria.  TREATMENT If you test positive for GBS, you may need treatment with an antibiotic medicine during labor. As soon as you go into labor, or as soon as  your membranes rupture, you will get the antibiotic medicine through an IV access. You will continue to get the medicine until after you give birth. You do not need antibiotic medicine if you are having a cesarean delivery.If your baby shows signs or symptoms of GBS after birth, your baby can also be treated with an antibiotic medicine. HOME CARE INSTRUCTIONS   Take all antibiotic medicine as prescribed by your health care provider. Only take medicine as directed.   Continue with prenatal visits and care.   Keep all follow-up appointments.  SEEK MEDICAL CARE IF:   You have pain when you urinate.   You have to urinate frequently.   You have a fever.  SEEK IMMEDIATE MEDICAL CARE IF:   Your membranes rupture.  You go into labor.   This information is not intended to replace advice given to you by your health care provider. Make sure you discuss any questions you have with your health care provider.   Document Released: 11/01/2007 Document Revised: 07/30/2013 Document Reviewed: 05/17/2013 Elsevier Interactive Patient Education 2016 [employer]  

## 2015-05-20 NOTE — Progress Notes (Signed)
Breastfeeding tip of the week reviewed Cultures Hgb Trace, Leukocytes:small

## 2015-05-21 LAB — GC/CHLAMYDIA PROBE AMP (~~LOC~~) NOT AT ARMC
Chlamydia: NEGATIVE
Neisseria Gonorrhea: NEGATIVE

## 2015-05-22 LAB — CULTURE, BETA STREP (GROUP B ONLY)

## 2015-05-27 ENCOUNTER — Ambulatory Visit (INDEPENDENT_AMBULATORY_CARE_PROVIDER_SITE_OTHER): Payer: Self-pay | Admitting: Advanced Practice Midwife

## 2015-05-27 VITALS — BP 113/55 | HR 92 | Wt 215.5 lb

## 2015-05-27 DIAGNOSIS — O09293 Supervision of pregnancy with other poor reproductive or obstetric history, third trimester: Secondary | ICD-10-CM | POA: Insufficient documentation

## 2015-05-27 DIAGNOSIS — Z3493 Encounter for supervision of normal pregnancy, unspecified, third trimester: Secondary | ICD-10-CM

## 2015-05-27 DIAGNOSIS — J4520 Mild intermittent asthma, uncomplicated: Secondary | ICD-10-CM

## 2015-05-27 LAB — POCT URINALYSIS DIP (DEVICE)
BILIRUBIN URINE: NEGATIVE
Glucose, UA: 500 mg/dL — AB
Hgb urine dipstick: NEGATIVE
KETONES UR: NEGATIVE mg/dL
LEUKOCYTES UA: NEGATIVE
NITRITE: NEGATIVE
PH: 7 (ref 5.0–8.0)
Protein, ur: NEGATIVE mg/dL
Specific Gravity, Urine: 1.025 (ref 1.005–1.030)
Urobilinogen, UA: 0.2 mg/dL (ref 0.0–1.0)

## 2015-05-27 NOTE — Progress Notes (Addendum)
Subjective:  Natalie Morgan is a 24 y.o. G3P2002 at 2170w6d being seen today for ongoing prenatal care.  Patient reports no complaints.  Contractions: Irregular.  Vag. Bleeding: None. Movement: Present. Denies leaking of fluid.   HIV records brought by pt at previous visit, but not yet scanned.    The following portions of the patient's history were reviewed and updated as appropriate: allergies, current medications, past family history, past medical history, past social history, past surgical history and problem list. Problem list updated.  Objective:   Filed Vitals:   05/27/15 1018  BP: 113/55  Pulse: 92  Weight: 215 lb 8 oz (97.75 kg)    Fetal Status: Fetal Heart Rate (bpm): 136   Movement: Present     Fundal height S>D.   General:  Alert, oriented and cooperative. Patient is in no acute distress.  Skin: Skin is warm and dry. No rash noted.   Cardiovascular: Normal heart rate noted  Respiratory: Normal respiratory effort, no problems with respiration noted  Abdomen: Soft, gravid, appropriate for gestational age. Pain/Pressure: Present     Pelvic: Vag. Bleeding: None     Cervical exam deferred        Extremities: Normal range of motion.  Edema: Trace  Mental Status: Normal mood and affect. Normal behavior. Normal judgment and thought content.   Urinalysis: Urine Protein: Negative Urine Glucose: 3+  Assessment and Plan:  Pregnancy: G3P2002 at 3870w6d  1. Asthma, mild intermittent, uncomplicated - controlled with prn meds  2. Supervision of normal pregnancy, third trimester Updated pregnancy box Reviewed labor precautions  3. Measures S>D, but baby feels smaller to pt than last baby. Proven to 9 lb w/out complication. Consider growth US PRN  Term labor symptoms and general obstetric precautions including but not limited to vaginal bleeding, contractions, leaking of fluid and fetal movement were reviewed in detail with the patient. Please refer to After Visit Summary  for other counseling recommendations.  Return in about 1 week (around 06/03/2015) for Routine prenatal care.   [place], CNM 05/27/2015 10:35 AM

## 2015-05-27 NOTE — Progress Notes (Signed)
Breastfeeding tip of the week reviewed. 

## 2015-05-27 NOTE — Patient Instructions (Signed)
Pain Relief During Labor and Delivery Everyone experiences pain differently, but labor causes severe pain for many women. The amount of pain you experience during labor and delivery depends on your pain tolerance, contraction strength, and your baby's size and position. There are many ways to prepare for and deal with the pain, including:   Taking prenatal classes to learn about labor and delivery. The more informed you are, the less anxious and afraid you may be. This can help lessen the pain.  Taking pain-relieving medicine during labor and delivery.  Learning breathing and relaxation techniques.  Taking a shower or bath.  Getting massaged.  Changing positions.  Placing an ice pack on your back. Discuss your pain control options with your health care provider during your prenatal visits.  WHAT ARE THE TWO TYPES OF PAIN-RELIEVING MEDICINES? 1. Analgesics. These are medicines that decrease pain without total loss of feeling or muscle movement. 2. Anesthetics. These are medicines that block all feeling, including pain. There can be minor side effects of both types, such as nausea, trouble concentrating, becoming sleepy, and lowering the heart rate of the baby. However, health care providers are careful to give doses that will not seriously affect the baby.  WHAT ARE THE SPECIFIC TYPES OF ANALGESICS AND ANESTHETICS? Systemic Analgesic Systemic pain medicines affect your whole body rather than focusing pain relief on the area of your body experiencing pain. This type of medicine is given either through an IV tube in your vein or by a shot (injection) into your muscle. This medicine will lessen your pain but will not stop it completely. It may also make you sleepy, but it will not make you lose consciousness.  Local Anesthetic Local anesthetic isused tonumb a small area of your body. The medicine is injected into the area of nerves that carry feeling to the vagina, vulva, or the area between  the vagina and anus (perineum).  General Anesthetic This type of medicine causes you to lose consciousness so you do not feel pain. It is usually used only in emergency situations during labor. It is given through an IV tube or face mask. Paracervical Block A paracervical block is a form of local anesthesia given during labor. Numbing medicine is injected into the right and left sides of the cervix and vagina. It helps to lessen the pain caused by contractions and stretching of the cervix. It may have to be given more than once.  Pudendal Block A pudendal block is another form of local anesthesia. It is used to relieve the pain associated with pushing or stretching of the perineum at the time of delivery. An injection is given deep through the vaginal wall into the pudendal nerve in the pelvis, numbing the perineum.  Epidural Anesthetic An epidural is an injection of numbing medicine given in the lower back and into the epidural space near your spinal cord. The epidural numbs the lower half of your body. You may be able to move your legs but will not be allowed to walk. Epidurals can be used for labor, delivery, or cesarean deliveries.  To prevent the medicine from wearing off, a small tube (catheter) may be threaded into the epidural space and taped in place to prevent it from slipping out. Medicine can then be given continuously in small doses through the tube until you deliver. Spinal Block A spinal block is similar to an epidural, but the medicine is injected into the spinal fluid, not the epidural space. A spinal block is only given   once. It starts to relieve pain quickly but lasts only 1-2 hours. Spinal blocks can also be used for cesarean deliveries.  Combined Spinal-Epidural Block Combined spinal-epidural blocks combine the benefits of both the spinal and epidural blocks. The spinal part acts quickly to relieve pain and the epidural provides continuous pain relief. Hydrotherapy Immersion in  warm water during labor may provide comfort and relaxation. It may also help to lessen pain, the use of anesthesia, and the length of labor. However, immersion in water during the delivery (water birth) may have some risk involved and studies to determine safety and risks are ongoing. If you are a healthy woman who is expecting an uncomplicated birth, talk with your health care provider to see if water birth is an option for you.    This information is not intended to replace advice given to you by your health care provider. Make sure you discuss any questions you have with your health care provider.   Document Released: 11/10/2008 Document Revised: 07/30/2013 Document Reviewed: 12/13/2012 Elsevier Interactive Patient Education 2016 Elsevier Inc.  

## 2015-05-28 ENCOUNTER — Encounter: Payer: Self-pay | Admitting: *Deleted

## 2015-05-28 DIAGNOSIS — Z3493 Encounter for supervision of normal pregnancy, unspecified, third trimester: Secondary | ICD-10-CM

## 2015-06-03 ENCOUNTER — Other Ambulatory Visit: Payer: Self-pay | Admitting: Family

## 2015-06-03 ENCOUNTER — Ambulatory Visit (INDEPENDENT_AMBULATORY_CARE_PROVIDER_SITE_OTHER): Payer: Self-pay | Admitting: Family

## 2015-06-03 ENCOUNTER — Ambulatory Visit (HOSPITAL_COMMUNITY)
Admission: RE | Admit: 2015-06-03 | Discharge: 2015-06-03 | Disposition: A | Discharge status: Home / Self Care | Payer: Self-pay | Source: Ambulatory Visit | Attending: Family | Admitting: Family

## 2015-06-03 ENCOUNTER — Encounter (HOSPITAL_COMMUNITY): Payer: Self-pay

## 2015-06-03 VITALS — BP 106/64 | HR 85 | Wt 221.0 lb

## 2015-06-03 VITALS — BP 104/63 | HR 82 | Temp 98.0°F | Wt 220.1 lb

## 2015-06-03 DIAGNOSIS — Z3A38 38 weeks gestation of pregnancy: Secondary | ICD-10-CM | POA: Insufficient documentation

## 2015-06-03 DIAGNOSIS — Z3493 Encounter for supervision of normal pregnancy, unspecified, third trimester: Secondary | ICD-10-CM

## 2015-06-03 DIAGNOSIS — O3663X1 Maternal care for excessive fetal growth, third trimester, fetus 1: Secondary | ICD-10-CM

## 2015-06-03 DIAGNOSIS — O3663X0 Maternal care for excessive fetal growth, third trimester, not applicable or unspecified: Secondary | ICD-10-CM

## 2015-06-03 DIAGNOSIS — O4443 Low lying placenta NOS or without hemorrhage, third trimester: Secondary | ICD-10-CM

## 2015-06-03 DIAGNOSIS — O4440 Low lying placenta NOS or without hemorrhage, unspecified trimester: Secondary | ICD-10-CM

## 2015-06-03 LAB — POCT URINALYSIS DIP (DEVICE)
Bilirubin Urine: NEGATIVE
GLUCOSE, UA: NEGATIVE mg/dL
Hgb urine dipstick: NEGATIVE
Ketones, ur: NEGATIVE mg/dL
LEUKOCYTES UA: NEGATIVE
NITRITE: NEGATIVE
PROTEIN: NEGATIVE mg/dL
Specific Gravity, Urine: 1.025 (ref 1.005–1.030)
UROBILINOGEN UA: 0.2 mg/dL (ref 0.0–1.0)
pH: 7 (ref 5.0–8.0)

## 2015-06-03 NOTE — Progress Notes (Signed)
Subjective:  Natalie Morgan is a 24 y.o. G3P2002 at 5167w6d being seen today for ongoing prenatal care.  Patient reports dampness on underwear, questionable ROM.  Contractions: Irregular.  Vag. Bleeding: None. Movement: Present. Denies leaking of fluid.   The following portions of the patient's history were reviewed and updated as appropriate: allergies, current medications, past family history, past medical history, past social history, past surgical history and problem list. Problem list updated.  Objective:   Filed Vitals:   06/03/15 1010  BP: 104/63  Pulse: 82  Temp: 98 F (36.7 C)  Weight: 220 lb 1.6 oz (99.837 kg)    Fetal Status: Fetal Heart Rate (bpm): 155 Fundal Height: 45 cm Movement: Present     General:  Alert, oriented and cooperative. Patient is in no acute distress.  Skin: Skin is warm and dry. No rash noted.   Cardiovascular: Normal heart rate noted  Respiratory: Normal respiratory effort, no problems with respiration noted  Abdomen: Soft, gravid, appropriate for gestational age. Pain/Pressure: Present     Pelvic: Vag. Bleeding: None Vag D/C Character: Other (Comment)  Thick white dc seen with speculum exam; negative pooling, negative ferning Cervical exam performed Dilation: 2.5 Effacement (%): Thick Station: -3  Extremities: Normal range of motion.  Edema: Trace  Mental Status: Normal mood and affect. Normal behavior. Normal judgment and thought content.   Urinalysis: Urine Protein: Negative Urine Glucose: Negative  Assessment and Plan:  Pregnancy: G3P2002 at 8967w6d  1. Supervision of normal pregnancy, third trimester  2. Large for gestational age fetus affecting management of mother, antepartum, third trimester, fetus 1 - US MFM OB FOLLOW UP; Future  3.  Vaginal Discharge - Growth US today with AFI  Term labor symptoms and general obstetric precautions including but not limited to vaginal bleeding, contractions, leaking of fluid and fetal movement were  reviewed in detail with the patient. Please refer to After Visit Summary for other counseling recommendations.  Return in about 1 week (around 06/10/2015).   Eino FarberWalidah Kennith GainN Karim, CNM

## 2015-06-03 NOTE — ED Notes (Signed)
C/O clear, watery discharge since 05/31/15.

## 2015-06-03 NOTE — Progress Notes (Signed)
Pressure- vaginal/pelvic

## 2015-06-04 ENCOUNTER — Telehealth: Payer: Self-pay

## 2015-06-04 NOTE — Telephone Encounter (Signed)
[facility] called about patients mirena application. It was not filled out completely. Provider information is missing as well as patients signature. Patient needs to resubmit application. Fax to [phone]

## 2015-06-05 ENCOUNTER — Inpatient Hospital Stay (HOSPITAL_COMMUNITY)
Admit: 2015-06-05 | Discharge: 2015-06-05 | Disposition: A | Discharge status: Home / Self Care | Payer: Self-pay | Source: Ambulatory Visit | Attending: Family Medicine | Admitting: Family Medicine

## 2015-06-05 ENCOUNTER — Encounter (HOSPITAL_COMMUNITY): Payer: Self-pay | Admitting: *Deleted

## 2015-06-05 DIAGNOSIS — Z3493 Encounter for supervision of normal pregnancy, unspecified, third trimester: Secondary | ICD-10-CM | POA: Insufficient documentation

## 2015-06-05 NOTE — Discharge Instructions (Signed)
Braxton Hicks Contractions °Contractions of the uterus can occur throughout pregnancy. Contractions are not always a sign that you are in labor.  °WHAT ARE BRAXTON HICKS CONTRACTIONS?  °Contractions that occur before labor are called Braxton Hicks contractions, or false labor. Toward the end of pregnancy (32-34 weeks), these contractions can develop more often and may become more forceful. This is not true labor because these contractions do not result in opening (dilatation) and thinning of the cervix. They are sometimes difficult to tell apart from true labor because these contractions can be forceful and people have different pain tolerances. You should not feel embarrassed if you go to the hospital with false labor. Sometimes, the only way to tell if you are in true labor is for your health care provider to look for changes in the cervix. °If there are no prenatal problems or other health problems associated with the pregnancy, it is completely safe to be sent home with false labor and await the onset of true labor. °HOW CAN YOU TELL THE DIFFERENCE BETWEEN TRUE AND FALSE LABOR? °False Labor °· The contractions of false labor are usually shorter and not as hard as those of true labor.   °· The contractions are usually irregular.   °· The contractions are often felt in the front of the lower abdomen and in the groin.   °· The contractions may go away when you walk around or change positions while lying down.   °· The contractions get weaker and are shorter lasting as time goes on.   °· The contractions do not usually become progressively stronger, regular, and closer together as with true labor.   °True Labor °1. Contractions in true labor last 30-70 seconds, become very regular, usually become more intense, and increase in frequency.   °2. The contractions do not go away with walking.   °3. The discomfort is usually felt in the top of the uterus and spreads to the lower abdomen and low back.   °4. True labor can  be determined by your health care provider with an exam. This will show that the cervix is dilating and getting thinner.   °WHAT TO REMEMBER °· Keep up with your usual exercises and follow other instructions given by your health care provider.   °· Take medicines as directed by your health care provider.   °· Keep your regular prenatal appointments.   °· Eat and drink lightly if you think you are going into labor.   °· If Braxton Hicks contractions are making you uncomfortable:   °· Change your position from lying down or resting to walking, or from walking to resting.   °· Sit and rest in a tub of warm water.   °· Drink 2-3 glasses of water. Dehydration may cause these contractions.   °· Do slow and deep breathing several times an hour.   °WHEN SHOULD I SEEK IMMEDIATE MEDICAL CARE? °Seek immediate medical care if: °· Your contractions become stronger, more regular, and closer together.   °· You have fluid leaking or gushing from your vagina.   °· You have a fever.   °· You pass blood-tinged mucus.   °· You have vaginal bleeding.   °· You have continuous abdominal pain.   °· You have low back pain that you never had before.   °· You feel your baby's head pushing down and causing pelvic pressure.   °· Your baby is not moving as much as it used to.   °  °This information is not intended to replace advice given to you by your health care provider. Make sure you discuss any questions you have with your health care   provider. °  °Document Released: 07/25/2005 Document Revised: 07/30/2013 Document Reviewed: 05/06/2013 °Elsevier Interactive Patient Education ©2016 [employer] Inc[employer] ° °Fetal Movement Counts °Patient Name: __________________________________________________ Patient Due Date: ____________________ °Performing a fetal movement count is highly recommended in high-risk pregnancies, but it is good for every pregnant woman to do. Your health care provider may ask you to start counting fetal movements at 28 weeks of the  pregnancy. Fetal movements often increase: °· After eating a full meal. °· After physical activity. °· After eating or drinking something sweet or cold. °· At rest. °Pay attention to when you feel the baby is most active. This will help you notice a pattern of your baby's sleep and wake cycles and what factors contribute to an increase in fetal movement. It is important to perform a fetal movement count at the same time each day when your baby is normally most active.  °HOW TO COUNT FETAL MOVEMENTS °5. Find a quiet and comfortable area to sit or lie down on your left side. Lying on your left side provides the best blood and oxygen circulation to your baby. °6. Write down the day and time on a sheet of paper or in a journal. °7. Start counting kicks, flutters, swishes, rolls, or jabs in a 2-hour period. You should feel at least 10 movements within 2 hours. °8. If you do not feel 10 movements in 2 hours, wait 2-3 hours and count again. Look for a change in the pattern or not enough counts in 2 hours. °SEEK MEDICAL CARE IF: °· You feel less than 10 counts in 2 hours, tried twice. °· There is no movement in over an hour. °· The pattern is changing or taking longer each day to reach 10 counts in 2 hours. °· You feel the baby is not moving as he or she usually does. °Date: ____________ Movements: ____________ Start time: ____________ Finish time: ____________  °Date: ____________ Movements: ____________ Start time: ____________ Finish time: ____________ °Date: ____________ Movements: ____________ Start time: ____________ Finish time: ____________ °Date: ____________ Movements: ____________ Start time: ____________ Finish time: ____________ °Date: ____________ Movements: ____________ Start time: ____________ Finish time: ____________ °Date: ____________ Movements: ____________ Start time: ____________ Finish time: ____________ °Date: ____________ Movements: ____________ Start time: ____________ Finish time:  ____________ °Date: ____________ Movements: ____________ Start time: ____________ Finish time: ____________  °Date: ____________ Movements: ____________ Start time: ____________ Finish time: ____________ °Date: ____________ Movements: ____________ Start time: ____________ Finish time: ____________ °Date: ____________ Movements: ____________ Start time: ____________ Finish time: ____________ °Date: ____________ Movements: ____________ Start time: ____________ Finish time: ____________ °Date: ____________ Movements: ____________ Start time: ____________ Finish time: ____________ °Date: ____________ Movements: ____________ Start time: ____________ Finish time: ____________ °Date: ____________ Movements: ____________ Start time: ____________ Finish time: ____________  °Date: ____________ Movements: ____________ Start time: ____________ Finish time: ____________ °Date: ____________ Movements: ____________ Start time: ____________ Finish time: ____________ °Date: ____________ Movements: ____________ Start time: ____________ Finish time: ____________ °Date: ____________ Movements: ____________ Start time: ____________ Finish time: ____________ °Date: ____________ Movements: ____________ Start time: ____________ Finish time: ____________ °Date: ____________ Movements: ____________ Start time: ____________ Finish time: ____________ °Date: ____________ Movements: ____________ Start time: ____________ Finish time: ____________  °Date: ____________ Movements: ____________ Start time: ____________ Finish time: ____________ °Date: ____________ Movements: ____________ Start time: ____________ Finish time: ____________ °Date: ____________ Movements: ____________ Start time: ____________ Finish time: ____________ °Date: ____________ Movements: ____________ Start time: ____________ Finish time: ____________ °Date: ____________ Movements: ____________ Start time: ____________ Finish time: ____________ °Date: ____________ Movements:  ____________ Start time: ____________ Finish   time: ____________ °Date: ____________ Movements: ____________ Start time: ____________ Finish time: ____________  °Date: ____________ Movements: ____________ Start time: ____________ Finish time: ____________ °Date: ____________ Movements: ____________ Start time: ____________ Finish time: ____________ °Date: ____________ Movements: ____________ Start time: ____________ Finish time: ____________ °Date: ____________ Movements: ____________ Start time: ____________ Finish time: ____________ °Date: ____________ Movements: ____________ Start time: ____________ Finish time: ____________ °Date: ____________ Movements: ____________ Start time: ____________ Finish time: ____________ °Date: ____________ Movements: ____________ Start time: ____________ Finish time: ____________  °Date: ____________ Movements: ____________ Start time: ____________ Finish time: ____________ °Date: ____________ Movements: ____________ Start time: ____________ Finish time: ____________ °Date: ____________ Movements: ____________ Start time: ____________ Finish time: ____________ °Date: ____________ Movements: ____________ Start time: ____________ Finish time: ____________ °Date: ____________ Movements: ____________ Start time: ____________ Finish time: ____________ °Date: ____________ Movements: ____________ Start time: ____________ Finish time: ____________ °Date: ____________ Movements: ____________ Start time: ____________ Finish time: ____________  °Date: ____________ Movements: ____________ Start time: ____________ Finish time: ____________ °Date: ____________ Movements: ____________ Start time: ____________ Finish time: ____________ °Date: ____________ Movements: ____________ Start time: ____________ Finish time: ____________ °Date: ____________ Movements: ____________ Start time: ____________ Finish time: ____________ °Date: ____________ Movements: ____________ Start time: ____________ Finish  time: ____________ °Date: ____________ Movements: ____________ Start time: ____________ Finish time: ____________ °Date: ____________ Movements: ____________ Start time: ____________ Finish time: ____________  °Date: ____________ Movements: ____________ Start time: ____________ Finish time: ____________ °Date: ____________ Movements: ____________ Start time: ____________ Finish time: ____________ °Date: ____________ Movements: ____________ Start time: ____________ Finish time: ____________ °Date: ____________ Movements: ____________ Start time: ____________ Finish time: ____________ °Date: ____________ Movements: ____________ Start time: ____________ Finish time: ____________ °Date: ____________ Movements: ____________ Start time: ____________ Finish time: ____________ °  °This information is not intended to replace advice given to you by your health care provider. Make sure you discuss any questions you have with your health care provider. °  °Document Released: 08/24/2006 Document Revised: 08/15/2014 Document Reviewed: 05/21/2012 °Elsevier Interactive Patient Education ©2016 Else[employer] Inc. ° °

## 2015-06-05 NOTE — MAU Note (Signed)
Recheck pt in one hour 

## 2015-06-05 NOTE — MAU Note (Signed)
PT SAYS HURT BAD AT 0100. [facility]    VE IN CLINIC   ON WED  3   CM.     GBS- NEG.    DENIES HSV AND MRSA.     WAS  GOING  TO DR  MARSHALL  BUT  WAS  TRANSFERRED  HERE  BC OF LOW - LYING  PLACENTA-  ALL OK NOW

## 2015-06-08 ENCOUNTER — Ambulatory Visit (HOSPITAL_COMMUNITY): Payer: Self-pay

## 2015-06-10 ENCOUNTER — Ambulatory Visit (INDEPENDENT_AMBULATORY_CARE_PROVIDER_SITE_OTHER): Payer: Self-pay | Admitting: Advanced Practice Midwife

## 2015-06-10 VITALS — BP 114/61 | HR 80 | Temp 98.5°F | Wt 225.3 lb

## 2015-06-10 DIAGNOSIS — O3663X1 Maternal care for excessive fetal growth, third trimester, fetus 1: Secondary | ICD-10-CM

## 2015-06-10 DIAGNOSIS — O3660X0 Maternal care for excessive fetal growth, unspecified trimester, not applicable or unspecified: Secondary | ICD-10-CM | POA: Insufficient documentation

## 2015-06-10 DIAGNOSIS — O3663X0 Maternal care for excessive fetal growth, third trimester, not applicable or unspecified: Secondary | ICD-10-CM

## 2015-06-10 DIAGNOSIS — O4443 Low lying placenta NOS or without hemorrhage, third trimester: Secondary | ICD-10-CM

## 2015-06-10 LAB — POCT URINALYSIS DIP (DEVICE)
GLUCOSE, UA: NEGATIVE mg/dL
Ketones, ur: NEGATIVE mg/dL
NITRITE: NEGATIVE
PROTEIN: NEGATIVE mg/dL
SPECIFIC GRAVITY, URINE: 1.025 (ref 1.005–1.030)
UROBILINOGEN UA: 1 mg/dL (ref 0.0–1.0)
pH: 6.5 (ref 5.0–8.0)

## 2015-06-10 NOTE — Progress Notes (Signed)
C/o feeling hot/sweaty during the night. Scheduled for IOL. 06/18/15 at 0730.

## 2015-06-10 NOTE — Progress Notes (Signed)
Subjective:  Natalie Morgan is a 24 y.o. G3P2002 at 4798w6d being seen today for ongoing prenatal care.  Patient reports irregular contractions.  Contractions: Irregular.  Vag. Bleeding: None. Movement: Present. Denies leaking of fluid.   The following portions of the patient's history were reviewed and updated as appropriate: allergies, current medications, past family history, past medical history, past social history, past surgical history and problem list. Problem list updated.  Objective:   Filed Vitals:   06/10/15 1123  BP: 114/61  Pulse: 80  Temp: 98.5 F (36.9 C)  Weight: 225 lb 4.8 oz (102.195 kg)    Fetal Status: Fetal Heart Rate (bpm): 135 Fundal Height: 46 cm Movement: Present  Presentation: Vertex  General:  Alert, oriented and cooperative. Patient is in no acute distress.  Skin: Skin is warm and dry. No rash noted.   Cardiovascular: Normal heart rate noted  Respiratory: Normal respiratory effort, no problems with respiration noted  Abdomen: Soft, gravid, appropriate for gestational age. Pain/Pressure: Present     Pelvic: Vag. Bleeding: None Vag D/C Character: Mucous   Cervical exam performed Dilation: 5 Effacement (%): 50 Station: -2  Extremities: Normal range of motion.  Edema: Mild pitting, slight indentation  Mental Status: Normal mood and affect. Normal behavior. Normal judgment and thought content.   Urinalysis: Urine Protein: Negative Urine Glucose: Negative  Assessment and Plan:  Pregnancy: G3P2002 at 2798w6d  1. Large for gestational age fetus affecting management of mother, antepartum, third trimester, fetus 1 --EFW >90%tile, less than 5000 g with previous hx of macrosomic infant.  Good candidate for vaginal delivery but discussed with pt risks of macrosomia.  Will closely monitor labor curve and will not recommend operative vaginal delivery. --IOL scheduled for 41 weeks  Term labor symptoms and general obstetric precautions including but not limited to  vaginal bleeding, contractions, leaking of fluid and fetal movement were reviewed in detail with the patient. Please refer to After Visit Summary for other counseling recommendations.  Return in about 1 week (around 06/17/2015).   Hurshel PartyLisa A Leftwich-Kirby, CNM

## 2015-06-14 ENCOUNTER — Encounter (HOSPITAL_COMMUNITY): Payer: Self-pay | Admitting: *Deleted

## 2015-06-14 ENCOUNTER — Inpatient Hospital Stay (HOSPITAL_COMMUNITY)
Admission: AD | Admit: 2015-06-14 | Discharge: 2015-06-16 | DRG: 775 | Disposition: A | Discharge status: Home / Self Care | Payer: Medicaid Other | Source: Ambulatory Visit | Attending: Obstetrics & Gynecology | Admitting: Obstetrics & Gynecology

## 2015-06-14 DIAGNOSIS — O480 Post-term pregnancy: Secondary | ICD-10-CM | POA: Diagnosis present

## 2015-06-14 DIAGNOSIS — Z809 Family history of malignant neoplasm, unspecified: Secondary | ICD-10-CM | POA: Diagnosis not present

## 2015-06-14 DIAGNOSIS — O3663X0 Maternal care for excessive fetal growth, third trimester, not applicable or unspecified: Secondary | ICD-10-CM | POA: Diagnosis present

## 2015-06-14 DIAGNOSIS — Z3A40 40 weeks gestation of pregnancy: Secondary | ICD-10-CM

## 2015-06-14 DIAGNOSIS — Z8249 Family history of ischemic heart disease and other diseases of the circulatory system: Secondary | ICD-10-CM

## 2015-06-14 DIAGNOSIS — Z833 Family history of diabetes mellitus: Secondary | ICD-10-CM

## 2015-06-14 DIAGNOSIS — Z3493 Encounter for supervision of normal pregnancy, unspecified, third trimester: Secondary | ICD-10-CM

## 2015-06-14 LAB — CBC
HEMATOCRIT: 36.5 % (ref 36.0–46.0)
HEMOGLOBIN: 11.8 g/dL — AB (ref 12.0–15.0)
MCH: 26.4 pg (ref 26.0–34.0)
MCHC: 32.3 g/dL (ref 30.0–36.0)
MCV: 81.7 fL (ref 78.0–100.0)
Platelets: 234 10*3/uL (ref 150–400)
RBC: 4.47 MIL/uL (ref 3.87–5.11)
RDW: 16 % — AB (ref 11.5–15.5)
WBC: 5.4 10*3/uL (ref 4.0–10.5)

## 2015-06-14 LAB — TYPE AND SCREEN
ABO/RH(D): A POS
ANTIBODY SCREEN: NEGATIVE

## 2015-06-14 LAB — HEPATITIS B SURFACE ANTIGEN: Hepatitis B Surface Ag: NEGATIVE

## 2015-06-14 MED ORDER — SODIUM CHLORIDE 0.9 % IV SOLN: 250.0000 mL | INTRAVENOUS | Status: DC | PRN

## 2015-06-14 MED ORDER — ALBUTEROL SULFATE (2.5 MG/3ML) 0.083% IN NEBU: 3.0000 mL | INHALATION_SOLUTION | Freq: Four times a day (QID) | RESPIRATORY_TRACT | Status: DC | PRN

## 2015-06-14 MED ORDER — TERBUTALINE SULFATE 1 MG/ML IJ SOLN
0.2500 mg | Freq: Once | INTRAMUSCULAR | Status: DC | PRN
  Filled 2015-06-14: qty 1

## 2015-06-14 MED ORDER — OXYCODONE-ACETAMINOPHEN 5-325 MG PO TABS: 2.0000 | ORAL_TABLET | ORAL | Status: DC | PRN

## 2015-06-14 MED ORDER — SODIUM CHLORIDE 0.9 % IJ SOLN: 3.0000 mL | Freq: Two times a day (BID) | INTRAMUSCULAR | Status: DC

## 2015-06-14 MED ORDER — OXYCODONE-ACETAMINOPHEN 5-325 MG PO TABS: 1.0000 | ORAL_TABLET | ORAL | Status: DC | PRN

## 2015-06-14 MED ORDER — LANOLIN HYDROUS EX OINT
TOPICAL_OINTMENT | CUTANEOUS | Status: DC | PRN
Start: 2015-06-14 — End: 2015-06-16

## 2015-06-14 MED ORDER — DIBUCAINE 1 % RE OINT: 1.0000 "application " | TOPICAL_OINTMENT | RECTAL | Status: DC | PRN

## 2015-06-14 MED ORDER — ONDANSETRON HCL 4 MG/2ML IJ SOLN: 4.0000 mg | Freq: Four times a day (QID) | INTRAMUSCULAR | Status: DC | PRN

## 2015-06-14 MED ORDER — SODIUM CHLORIDE 0.9 % IJ SOLN: 3.0000 mL | INTRAMUSCULAR | Status: DC | PRN

## 2015-06-14 MED ORDER — BENZOCAINE-MENTHOL 20-0.5 % EX AERO
1.0000 "application " | INHALATION_SPRAY | CUTANEOUS | Status: DC | PRN
  Administered 2015-06-14: 1 via TOPICAL
  Filled 2015-06-14: qty 56

## 2015-06-14 MED ORDER — ACETAMINOPHEN 325 MG PO TABS: 650.0000 mg | ORAL_TABLET | ORAL | Status: DC | PRN

## 2015-06-14 MED ORDER — OXYTOCIN BOLUS FROM INFUSION: 500.0000 mL | INTRAVENOUS | Status: DC

## 2015-06-14 MED ORDER — SIMETHICONE 80 MG PO CHEW: 80.0000 mg | CHEWABLE_TABLET | ORAL | Status: DC | PRN

## 2015-06-14 MED ORDER — SENNOSIDES-DOCUSATE SODIUM 8.6-50 MG PO TABS
2.0000 | ORAL_TABLET | ORAL | Status: DC
Start: 2015-06-15 — End: 2015-06-16
  Administered 2015-06-15 (×2): 2 via ORAL
  Filled 2015-06-14 (×2): qty 2

## 2015-06-14 MED ORDER — ONDANSETRON HCL 4 MG PO TABS: 4.0000 mg | ORAL_TABLET | ORAL | Status: DC | PRN

## 2015-06-14 MED ORDER — OXYTOCIN 40 UNITS IN LACTATED RINGERS INFUSION - SIMPLE MED
1.0000 m[IU]/min | INTRAVENOUS | Status: DC
  Administered 2015-06-14: 2 m[IU]/min via INTRAVENOUS
  Administered 2015-06-14: 4 m[IU]/min via INTRAVENOUS

## 2015-06-14 MED ORDER — DIPHENHYDRAMINE HCL 25 MG PO CAPS
25.0000 mg | ORAL_CAPSULE | Freq: Four times a day (QID) | ORAL | Status: DC | PRN
Start: 2015-06-14 — End: 2015-06-16

## 2015-06-14 MED ORDER — MEASLES, MUMPS & RUBELLA VAC ~~LOC~~ INJ
0.5000 mL | INJECTION | Freq: Once | SUBCUTANEOUS | Status: DC
  Filled 2015-06-14: qty 0.5

## 2015-06-14 MED ORDER — OXYTOCIN 40 UNITS IN LACTATED RINGERS INFUSION - SIMPLE MED: 62.5000 mL/h | INTRAVENOUS | Status: DC | PRN

## 2015-06-14 MED ORDER — FENTANYL CITRATE (PF) 100 MCG/2ML IJ SOLN
100.0000 ug | INTRAMUSCULAR | Status: DC | PRN
  Administered 2015-06-14 (×2): 100 ug via INTRAVENOUS
  Filled 2015-06-14 (×2): qty 2

## 2015-06-14 MED ORDER — CITRIC ACID-SODIUM CITRATE 334-500 MG/5ML PO SOLN: 30.0000 mL | ORAL | Status: DC | PRN

## 2015-06-14 MED ORDER — LACTATED RINGERS IV SOLN
500.0000 mL | INTRAVENOUS | Status: DC | PRN
  Administered 2015-06-14: 500 mL via INTRAVENOUS

## 2015-06-14 MED ORDER — LACTATED RINGERS IV SOLN
INTRAVENOUS | Status: DC
  Administered 2015-06-14: 11:00:00 via INTRAVENOUS

## 2015-06-14 MED ORDER — ONDANSETRON HCL 4 MG/2ML IJ SOLN: 4.0000 mg | INTRAMUSCULAR | Status: DC | PRN

## 2015-06-14 MED ORDER — PRENATAL MULTIVITAMIN CH
1.0000 | ORAL_TABLET | Freq: Every day | ORAL | Status: DC
  Administered 2015-06-15: 1 via ORAL
  Filled 2015-06-14: qty 1

## 2015-06-14 MED ORDER — LIDOCAINE HCL (PF) 1 % IJ SOLN
30.0000 mL | INTRAMUSCULAR | Status: DC | PRN
Start: 2015-06-14 — End: 2015-06-14
  Filled 2015-06-14: qty 30

## 2015-06-14 MED ORDER — WITCH HAZEL-GLYCERIN EX PADS: 1.0000 "application " | MEDICATED_PAD | CUTANEOUS | Status: DC | PRN

## 2015-06-14 MED ORDER — IBUPROFEN 600 MG PO TABS
600.0000 mg | ORAL_TABLET | Freq: Four times a day (QID) | ORAL | Status: DC
  Administered 2015-06-14 – 2015-06-16 (×7): 600 mg via ORAL
  Filled 2015-06-14 (×7): qty 1

## 2015-06-14 MED ORDER — ZOLPIDEM TARTRATE 5 MG PO TABS: 5.0000 mg | ORAL_TABLET | Freq: Every evening | ORAL | Status: DC | PRN

## 2015-06-14 MED ORDER — OXYTOCIN 40 UNITS IN LACTATED RINGERS INFUSION - SIMPLE MED
62.5000 mL/h | INTRAVENOUS | Status: DC
  Filled 2015-06-14: qty 1000

## 2015-06-14 MED ORDER — TETANUS-DIPHTH-ACELL PERTUSSIS 5-2.5-18.5 LF-MCG/0.5 IM SUSP: 0.5000 mL | Freq: Once | INTRAMUSCULAR | Status: DC

## 2015-06-14 NOTE — Progress Notes (Signed)
Paula ComptonKarla Delangel Mariah MillingMorales is a 24 y.o. G3P2002 at 4463w3d by ultrasound admitted for active labor  Subjective:   Objective: BP 128/68 mmHg  Pulse 72  Temp(Src) 98.2 F (36.8 C) (Oral)  Resp 18  Ht 5\' 5"  (1.651 m)  Wt 225 lb (102.059 kg)  BMI 37.44 kg/m2  LMP 09/04/2014      FHT:  FHR: 130 bpm, variability: moderate,  accelerations:  Abscent,  decelerations:  Absent UC:   regular, every 2 minutes SVE:   Dilation: 7.5 Effacement (%): 80 Station: -1 Exam by:: Eastman ChemicalMiddleton, RN  Labs: Lab Results  Component Value Date   WBC 5.4 06/14/2015   HGB 11.8* 06/14/2015   HCT 36.5 06/14/2015   MCV 81.7 06/14/2015   PLT 234 06/14/2015    Assessment / Plan: Augmentation of labor, progressing well  Labor: Progressing normally Preeclampsia:  no signs or symptoms of toxicity and intake and ouput balanced Fetal Wellbeing:  Category I Pain Control:  Fentanyl I/D:  n/a Anticipated MOD:  NSVD  Jacaden Forbush DARLENE 06/14/2015, 3:01 PM

## 2015-06-14 NOTE — MAU Note (Signed)
Pt presents to MAU with  Complaints of rupture of membranes at 430 this morning. Denies any vaginal bleeding

## 2015-06-14 NOTE — H&P (Signed)
LABOR ADMISSION HISTORY AND PHYSICAL  Natalie Morgan is a 24 y.o. female G3P2002 with IUP at 5140w3d by LMP presenting for SVD. She reports +FM and vaginal fluid. She noticed a rush of fluid from her vagina this morning at 4:30am with leaking ever since. She denies contractions, vaginal bleeding, blurry vision, headaches, abdominal pain. She does have peripheral edema that started last week.  She plans on breastfeeding. She requests Mirena for birth control.  Dating: By LMP --->  Estimated Date of Delivery: 06/11/15  Sono:    @[redacted]w[redacted]d , CWD, normal anatomy, breech presentation, longitudinal lie, 262g, 32%tile EFW @[redacted]w[redacted]d , CWD, normal anatomy, cephalic presentation, longitudinal lie @[redacted]w[redacted]d , CWD, normal anatomy, cephalic presentation, longitudinal lie, 4724g, >90th%tile  Prenatal History/Complications:  Past Medical History: Past Medical History  Diagnosis Date  . Recurrent upper respiratory infection (URI)   . Asthma     no meds    Past Surgical History: Past Surgical History  Procedure Laterality Date  . Toenail excision      Obstetrical History: OB History    Gravida Para Term Preterm AB TAB SAB Ectopic Multiple Living   3 2 2       2       Social History: Social History   Social History  . Marital Status: Married    Spouse Name: N/A  . Number of Children: N/A  . Years of Education: N/A   Social History Main Topics  . Smoking status: Never Smoker   . Smokeless tobacco: Never Used  . Alcohol Use: No  . Drug Use: No  . Sexual Activity: Yes    Birth Control/ Protection: None   Other Topics Concern  . None   Social History Narrative   [place] and 2 children (boy and girl). She is a Futures traderhomemaker. No one in the house smokes.    Family History: Family History  Problem Relation Age of Onset  . Cancer Maternal Aunt   . Hypertension Mother   . Diabetes Mother     Allergies: No Known Allergies  Prescriptions prior to admission   Medication Sig Dispense Refill Last Dose  . albuterol (PROVENTIL HFA;VENTOLIN HFA) 108 (90 BASE) MCG/ACT inhaler Inhale 2 puffs into the lungs every 6 (six) hours as needed for wheezing or shortness of breath.   Past Week at Unknown time  . Prenatal Multivit-Min-Fe-FA (PRENATAL VITAMINS PO) Take 1 tablet by mouth daily.   06/13/2015 at Unknown time     Review of Systems   All systems reviewed and negative except as stated in HPI  BP 112/85 mmHg  Pulse 90  Temp(Src) 98.2 F (36.8 C) (Oral)  Resp 18  Ht 5\' 5"  (1.651 m)  Wt 102.059 kg (225 lb)  BMI 37.44 kg/m2  LMP 09/04/2014 General appearance: alert, cooperative and no distress Lungs: clear to auscultation bilaterally Heart: regular rate and rhythm Abdomen: gravid, nontender in midepigastrum and RUQ Pelvic: 7/80/-3 Extremities: Homans sign is negative, no calf pain, 1+ pitting edema Presentation: cephalic Fetal monitoring: 150s/+a/-d Uterine activity: contractions every 6 minutes   Prenatal labs: ABO, Rh: A pos Antibody: negative Rubella: Immune RPR: NON REAC (08/17 1137)  HBsAg: negative HIV: Non-reactive (07/25 0000)  GBS: Negative (10/12 0000)  1 hr Glucola: 113 Genetic screening: declined Anatomy US: normal  Prenatal Transfer Tool  Maternal Diabetes: No Genetic Screening: Declined Maternal Ultrasounds/Referrals: Normal Fetal Ultrasounds or other Referrals:  None Maternal Substance Abuse:  No Significant Maternal Medications:  None Significant Maternal Lab Results: Lab  values include: Group B Strep negative  Results for orders placed or performed during the hospital encounter of 06/14/15 (from the past 24 hour(s))  CBC   Collection Time: 06/14/15  9:40 AM  Result Value Ref Range   WBC 5.4 4.0 - 10.5 K/uL   RBC 4.47 3.87 - 5.11 MIL/uL   Hemoglobin 11.8 (L) 12.0 - 15.0 g/dL   HCT 09.8 11.9 - 14.7 %   MCV 81.7 78.0 - 100.0 fL   MCH 26.4 26.0 - 34.0 pg   MCHC 32.3 30.0 - 36.0 g/dL   RDW 82.9 (H) 56.2 -  15.5 %   Platelets 234 150 - 400 K/uL    Patient Active Problem List   Diagnosis Date Noted  . Labor and delivery indication for care or intervention 06/14/2015  . LGA (large for gestational age) fetus affecting mother, antepartum 06/10/2015  . Prior fetal macrosomia in third trimester, antepartum 05/27/2015  . Low lying placenta without hemorrhage, antepartum   . 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 Supervision of normal pregnancy 11/01/2013  . Hemorrhoid 12/21/2012  . Asthma 08/13/2009    Assessment: Natalie Morgan is a 24 y.o. G3P2002 at [redacted]w[redacted]d here for SVD. Macrosomia EFW 10lbs. Proven to 9 per pt.  #Labor: expectant management  #Pain:  IV medications #FWB:  Category 1 FHT #ID:   GBS neg #MOF:  Breast #MOC: Mirena #Circ:   No  Gabriel Rung, [facility] MS3  Attestation of Attending Supervision of Medical Student: Evaluation and management procedures were performed by the Medical Student under my supervision.  I have seen and examined the patient, reviewed the the student's note and chart, and I agree with management and plan.  Patient in active term labor with LGA infant. She is afebrile, other VSS and benign exam.  Will monitor labor progress closely.  Shoulder dystocia precautions in place. Expectant management for now. Hopeful for SVD  Jaynie Collins, MD, FACOG Attending Obstetrician & Gynecologist Faculty Practice, [facility]

## 2015-06-15 LAB — RPR: RPR Ser Ql: NONREACTIVE

## 2015-06-15 NOTE — Progress Notes (Signed)
Post Partum Day 1 Subjective: no complaints, up ad lib, voiding, tolerating PO and + flatus  Objective: Blood pressure 108/61, pulse 75, temperature 98.9 F (37.2 C), temperature source Oral, resp. rate 18, height 5\' 5"  (1.651 m), weight 225 lb (102.059 kg), last menstrual period 09/04/2014, SpO2 97 %, unknown if currently breastfeeding.  Physical Exam:  General: alert, cooperative, appears stated age and no distress Lochia: appropriate Uterine Fundus: firm Incision: n/a DVT Evaluation: Negative Homan's sign. No cords or calf tenderness.   Recent Labs  06/14/15 0940  HGB 11.8*  HCT 36.5    Assessment/Plan: Plan for discharge tomorrow   LOS: 1 day   Natalie Morgan DARLENE 06/15/2015, 7:13 AM

## 2015-06-15 NOTE — Lactation Note (Signed)
This note was copied from the chart of [facility]. Lactation Consultation Note  Patient Name: Natalie Arbutus LeasKarla Delangel Morgan ZOXWR'UToday's Date: 06/15/2015 Reason for consult: Follow-up assessment   Follow-up consult at 29 hrs old; GA 40.2; LGA BW 11 lbs, 5.8 oz.  3% weight loss with weight check this morning at 15 hrs old.  Mom is a P3. Infant has breastfed x11 (15-60 min) + attempts x2 (5 min) in past 24 hrs; voids-3; stools-1. Infant was on breast on right side in cross-cradle hold [facility].  Mom states infant had already been feeding for 45 minutes and was still feeding when LC entered room.     Frequent swallows heard while LC talked with mom.  LS-10.   Mom stated initial pain with latching but pain subsides after a few seconds.  Discussed transient nipple pain.  Denies any pain that persist throughout feeding and states she recognizes when infant is latched-on incorrectly; states she adjust position or re-latches. Educated on cluster feeding and encouraged to continue feeding with feeding cues.  Encouraged to continue exclusive breastfeeding.  Reviewed supply and demand. Mom was pleasant and receptive to consult.   Encouraged mom to call for assistance if needed.    Feeding Feeding Type: Breast Fed  LATCH Score/Interventions Latch: Grasps breast easily, tongue down, lips flanged, rhythmical sucking.  Audible Swallowing: Spontaneous and intermittent  Type of Nipple: Everted at rest and after stimulation  Comfort (Breast/Nipple): Soft / non-tender     Hold (Positioning): No assistance needed to correctly position infant at breast. Intervention(s): Support Pillows  LATCH Score: 10  Lactation Tools Discussed/Used     Consult Status Consult Status: Follow-up Date: 06/16/15 Follow-up type: In-patient    Lendon KaVann, Natalie Morgan 06/15/2015, 11:15 PM

## 2015-06-15 NOTE — Lactation Note (Signed)
This note was copied from the chart of [facility] Morales. Lactation Consultation Note Experienced BF mom BF  Her 1 1/23 yr old for 6 months. Mom has large longated nipples. Mom works it into the baby's mouth. Breast are tubular shaped and has little knots, encouraged to massage. Baby is 11.5 lbs. Has bruised face. Educated about jaundice and the need for BF, stooling, and voiding. Encouraged  I&O. Hand expressed nothing out. Explained if notes the output low then may need to supplement d/t bruising.  Mom encouraged to feed baby 8-12 times/24 hours and with feeding cues. Referred to Baby and Me Book in Breastfeeding section Pg. 22-23 for position options and Proper latch demonstration. Mom encouraged to do skin-to-skin. Educated about newborn behavior, jaundice, cluster feeding, supply and demand. WH/LC brochure given w/resources, support groups and LC services. Patient Name: Natalie Morgan LeasKarla Delangel Morales ZOXWR'UToday's Date: 06/15/2015 Reason for consult: Initial assessment   Maternal Data    Feeding Feeding Type: Breast Fed Length of feed: 60 min  LATCH Score/Interventions Latch: Grasps breast easily, tongue down, lips flanged, rhythmical sucking.  Audible Swallowing: A few with stimulation Intervention(s): Skin to skin;Hand expression  Type of Nipple: Everted at rest and after stimulation  Comfort (Breast/Nipple): Soft / non-tender     Hold (Positioning): No assistance needed to correctly position infant at breast. Intervention(s): Breastfeeding basics reviewed;Support Pillows;Position options;Skin to skin  LATCH Score: 9  Lactation Tools Discussed/Used     Consult Status Consult Status: Follow-up Date: 06/16/15 Follow-up type: In-patient    Charyl DancerCARVER, Bryanne Riquelme G 06/15/2015, 6:41 AM

## 2015-06-16 DIAGNOSIS — O3663X0 Maternal care for excessive fetal growth, third trimester, not applicable or unspecified: Secondary | ICD-10-CM

## 2015-06-16 MED ORDER — IBUPROFEN 600 MG PO TABS: 600.0000 mg | ORAL_TABLET | Freq: Four times a day (QID) | ORAL | Status: AC

## 2015-06-16 NOTE — Discharge Summary (Signed)
OB Discharge Summary  Patient Name: Natalie Morgan DOB: [birth year] MRN: [id number]  Date of admission: 06/14/2015 Delivering MD: Koren Shiver D   Date of discharge: 06/16/2015  Admitting diagnosis: 71 WKS CONTRACTIONS WATER BROKE  Intrauterine pregnancy: [redacted]w[redacted]d    Secondary diagnosis:Active Problems:   Labor and delivery indication for care or intervention   NSVD (normal spontaneous vaginal delivery)   Fetal macrosomia during pregnancy in third trimester  Additional problems: None     Discharge diagnosis: Term Pregnancy Delivered                                                                     Post partum procedures:MMR, Tdap  Augmentation: AROM and Pitocin  Complications: None  Hospital course:  Onset of Labor With Vaginal Delivery     24y.o. yo G3P3003 at 445w3das admitted in Active Laboron 06/14/2015. Patient had an uncomplicated labor course as follows:  Membrane Rupture Time/Date: 4:30 AM ,06/14/2015   Intrapartum Procedures: Episiotomy: None [1]                                         Lacerations:  None [1]  Patient had a delivery of a Viable infant. 06/14/2015  Information for the patient's newborn:  DeDayelin, Balducci0[[id number]]Delivery Method: Vag-Spont     Pateint had an uncomplicated postpartum course.  She is ambulating, tolerating a regular diet, passing flatus, and urinating well. Patient is discharged home in stable condition on 06/16/2015 12:43 PM.    Physical exam  Filed Vitals:   06/15/15 0045 06/15/15 0815 06/15/15 1749 06/16/15 0609  BP: 108/61 111/72 134/76 98/61  Pulse: 75 70 67 77  Temp: 98.9 F (37.2 C) 98.6 F (37 C) 98.4 F (36.9 C) 98.1 F (36.7 C)  TempSrc: Oral Oral Oral Oral  Resp: '18 18 18 18  ' Height:      Weight:      SpO2:  98%     General: alert, cooperative and no distress Lochia: appropriate Uterine Fundus: firm Incision: N/A DVT Evaluation: No evidence of DVT seen on physical exam. Negative  Homan's sign. No cords or calf tenderness. No significant calf/ankle edema. Labs: Lab Results  Component Value Date   WBC 5.4 06/14/2015   HGB 11.8* 06/14/2015   HCT 36.5 06/14/2015   MCV 81.7 06/14/2015   PLT 234 06/14/2015   CMP Latest Ref Rng 10/08/2009  Glucose 70-99 mg/dL 98  BUN 6-23 mg/dL 11  Creatinine 0.40-1.20 mg/dL 0.52  Sodium 135-145 meq/L 139  Potassium 3.5-5.3 meq/L 4.3  Chloride 96-112 meq/L 108  CO2 19-32 meq/L 19  Calcium 8.4-10.5 mg/dL 8.9  Total Protein 6.0-8.3 g/dL 6.1  Total Bilirubin 0.3-1.2 mg/dL 0.2(L)  Alkaline Phos 47-119 units/L 142(H)  AST 0-37 units/L 16  ALT 0-35 units/L 13    Discharge instruction: per After Visit Summary and "Baby and Me Booklet".  After Visit Meds:    Medication List    TAKE these medications        albuterol 108 (90 BASE) MCG/ACT inhaler  Commonly known as:  PROVENTIL HFA;VENTOLIN HFA  Inhale 2  puffs into the lungs every 6 (six) hours as needed for wheezing or shortness of breath.     ibuprofen 600 MG tablet  Commonly known as:  ADVIL,MOTRIN  Take 1 tablet (600 mg total) by mouth every 6 (six) hours.     PRENATAL VITAMINS PO  Take 1 tablet by mouth daily.        Diet: routine diet  Activity: Advance as tolerated. Pelvic rest for 6 weeks.   Outpatient follow up:6 weeks Follow up Appt: Future Appointments Date Time Provider [place]  07/20/2015 1:30 PM Woodroe Mode, MD [place] [facility]   Follow up visit: No Follow-up on file.  Postpartum contraception: IUD Mirena  Newborn Data: Live born female  Birth Weight: 11 lb 5.8 oz (5155 g) APGAR: 9, 9  Baby Feeding: Breast Disposition:home with mother  06/16/2015 Darci Needle, MD  [place], PGY-1

## 2015-06-16 NOTE — Plan of Care (Signed)
Problem: Coping: Goal: Ability to identify and utilize available resources and services will improve Has seen lactation consultant

## 2015-06-16 NOTE — Discharge Instructions (Signed)

## 2015-06-17 ENCOUNTER — Encounter: Payer: Self-pay | Admitting: Physician Assistant

## 2015-06-17 NOTE — Progress Notes (Signed)
Pt brought proof of income.  Information faxed to [employer] for patient assistance for Liletta.

## 2015-06-18 ENCOUNTER — Inpatient Hospital Stay (HOSPITAL_COMMUNITY): Admission: RE | Admit: 2015-06-18 | Payer: Self-pay | Source: Ambulatory Visit

## 2015-07-01 ENCOUNTER — Telehealth: Payer: Self-pay | Admitting: *Deleted

## 2015-07-01 DIAGNOSIS — Z30014 Encounter for initial prescription of intrauterine contraceptive device: Secondary | ICD-10-CM

## 2015-07-01 MED ORDER — LEVONORGESTREL 18.6 MCG/DAY IU IUD: 1.0000 | INTRAUTERINE_SYSTEM | Freq: Once | INTRAUTERINE | Status: DC

## 2015-07-01 NOTE — Telephone Encounter (Signed)
Received call from [email or web address], application for financial assistance for Natalie Morgan received but no prescription was attached. Requested Prescription be faxed to [phone]. Dr Shawnie PonsPratt notified that prescription in clinic waiting on her signature. Once signed, will fax to [employer].

## 2015-07-01 NOTE — Telephone Encounter (Signed)
Dr Shawnie PonsPratt signed prescription, faxed to [phone] to the attention of [employer].

## 2015-07-01 NOTE — Telephone Encounter (Signed)
Opened erroneously

## 2015-07-06 NOTE — Discharge Summary (Signed)
OB Discharge Summary  Patient Name: Natalie Morgan DOB: [birth year] MRN: [id number]  Date of admission: 06/14/2015 Delivering MD: Koren Shiver D   Date of discharge: 07/06/2015  Admitting diagnosis: 32 WKS CONTRACTIONS WATER BROKE  Intrauterine pregnancy: [redacted]w[redacted]d    Secondary diagnosis:Active Problems:   Labor and delivery indication for care or intervention   NSVD (normal spontaneous vaginal delivery)   Fetal macrosomia during pregnancy in third trimester  Additional problems: None     Discharge diagnosis: Term Pregnancy Delivered                                                                     Post partum procedures:MMR, Tdap  Augmentation: AROM and Pitocin  Complications: None  Hospital course:  Onset of Labor With Vaginal Delivery     24y.o. yo G3P3003 at 466w3das admitted in Active Laboron 06/14/2015. Patient had an uncomplicated labor course as follows:  Membrane Rupture Time/Date: 4:30 AM ,06/14/2015   Intrapartum Procedures: Episiotomy: None [1]                                         Lacerations:  None [1]  Patient had a delivery of a Viable infant. 06/14/2015  Information for the patient's newborn:  ArKandis Nab0[[id number]][place]ivery [place]: [place] uncomplicated postpartum course.  She is ambulating, tolerating a regular diet, passing flatus, and urinating well. Patient is discharged home in stable condition on 06/16/2015 12:43 PM.    Physical exam  Filed Vitals:   06/15/15 0045 06/15/15 0815 06/15/15 1749 06/16/15 0609  BP: 108/61 111/72 134/76 98/61  Pulse: 75 70 67 77  Temp: 98.9 F (37.2 C) 98.6 F (37 C) 98.4 F (36.9 C) 98.1 F (36.7 C)  TempSrc: Oral Oral Oral Oral  Resp: _0 Height:      Weight:      SpO2:  98%     General: alert, cooperative and no distress Lochia: appropriate Uterine Fundus: firm Incision: N/A DVT Evaluation: No evidence of DVT seen on physical exam. Negative  Homan's sign. No cords or calf tenderness. No significant calf/ankle edema. Labs: Lab Results  Component Value Date   WBC 5.4 06/14/2015   HGB 11.8* 06/14/2015   HCT 36.5 06/14/2015   MCV 81.7 06/14/2015   PLT 234 06/14/2015   CMP Latest Ref Rng 10/08/2009  Glucose 70-99 mg/dL 98  BUN 6-23 mg/dL 11  Creatinine 0.40-1.20 mg/dL 0.52  Sodium 135-145 meq/L 139  Potassium 3.5-5.3 meq/L 4.3  Chloride 96-112 meq/L 108  CO2 19-32 meq/L 19  Calcium 8.4-10.5 mg/dL 8.9  Total Protein 6.0-8.3 g/dL 6.1  Total Bilirubin 0.3-1.2 mg/dL 0.2(L)  Alkaline Phos 47-119 units/L 142(H)  AST 0-37 units/L 16  ALT 0-35 units/L 13    Discharge instruction: per After Visit Summary and "Baby and Me Booklet".  After Visit Meds:    Medication List    TAKE these medications        albuterol 108 (90 BASE) MCG/ACT inhaler  Commonly known as:  PROVENTIL HFA;VENTOLIN HFA  Inhale 2  puffs into the lungs every 6 (six) hours as needed for wheezing or shortness of breath.     ibuprofen 600 MG tablet  Commonly known as:  ADVIL,MOTRIN  Take 1 tablet (600 mg total) by mouth every 6 (six) hours.     PRENATAL VITAMINS PO  Take 1 tablet by mouth daily.        Diet: routine diet  Activity: Advance as tolerated. Pelvic rest for 6 weeks.   Outpatient follow up:6 weeks Follow up Appt: Future Appointments Date Time Provider [place]  07/20/2015 1:30 PM Woodroe Mode, MD [place] [facility]   Follow up visit: No Follow-up on file.  Postpartum contraception: IUD Mirena  Newborn Data: Live born female  Birth Weight: 11 lb 5.8 oz (5155 g) APGAR: 9, 9  Baby Feeding: Breast Disposition:home with mother  07/06/2015 Mayo Ao, CNM

## 2015-07-14 ENCOUNTER — Telehealth: Payer: Self-pay | Admitting: General Practice

## 2015-07-14 NOTE — Telephone Encounter (Signed)
Patient called and left message stating she wants to know if she was approved for the IUD or not. Patient states she has an appt coming up and hasn't heard anything about whether or not she was approved and she applied on 11/9

## 2015-07-15 NOTE — Telephone Encounter (Signed)
Patient called back into front office and I informed her of denial from [employer] but told patient that I completed the mirena application for her. Told patient part of the application was missing on our part so I completed that and faxed the application in for her. Told patient the approval takes 5 business days & offered that she can still come in for her appt on 12/12 & we can give her OCPs in the mean time or she can reschedule that appt. Patient requests to reschedule appt. Informed patient of new appt on 1/5 @ 1245. Patient verbalized understanding & had no other questions

## 2015-07-15 NOTE — Telephone Encounter (Signed)
Per chart review, patient completed liletta and mirena scholarship form. mirena form was not filled out entirely. Called [employer] assistance and was informed that they do not approve applications without a SSN. The patient has to be a valid [place] citizen. Application was denied. Completed mirena application and [facility]ed back to [facility]. Called patient, no answer- unable to leave message. Called patient's husband who stated patient wasn't in right now. Told him to let Paula ComptonKarla know we tried to return her call. He stated he would. Patient has pp visit on 12/12.

## 2015-07-20 ENCOUNTER — Ambulatory Visit: Payer: Self-pay | Admitting: Obstetrics & Gynecology

## 2015-08-13 ENCOUNTER — Ambulatory Visit: Payer: Self-pay | Admitting: Obstetrics & Gynecology

## 2015-08-21 ENCOUNTER — Telehealth: Payer: Self-pay

## 2015-08-21 NOTE — Telephone Encounter (Signed)
Several attempts have been made to get approval for patient Natalie Morgan. I reach out to the company and they suggested if patient does not have income she can put down zero income on the application. But when I call they are stating that is not a verified reason and patient was denied again. I have left a message for the department to call me back with a better understanding as to why patient has been denied.

## 2016-05-03 ENCOUNTER — Encounter: Payer: Self-pay | Admitting: *Deleted

## 2016-10-17 IMAGING — US US MFM OB TRANSVAGINAL
1 series · 14 of 14 positions shown · non-contrast
Comparison: none

[Series 1: us ob follow up · 14 acquisitions, 14 frames shown]
[im 1/14]
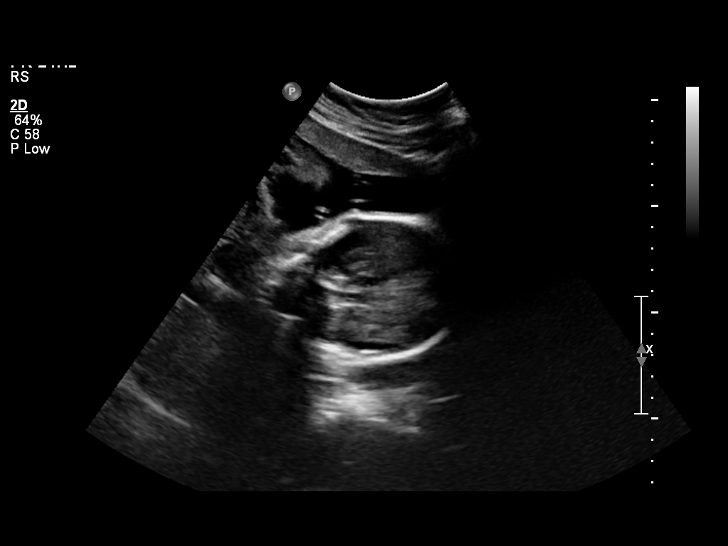
[im 2/14]
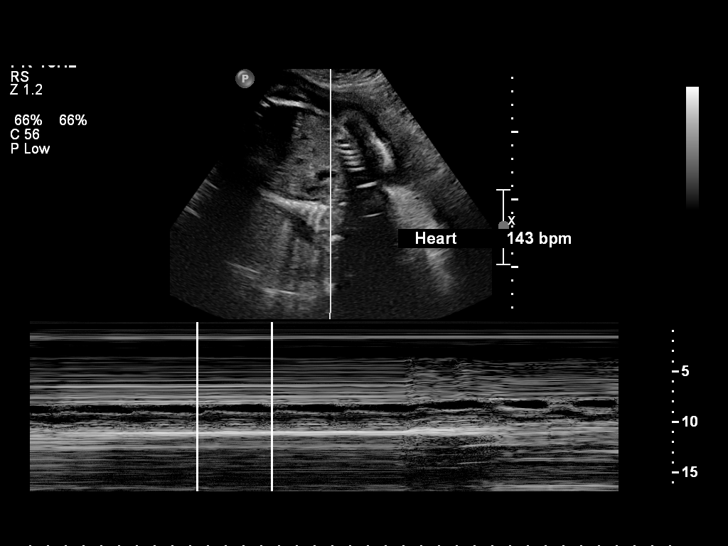
[im 3/14]
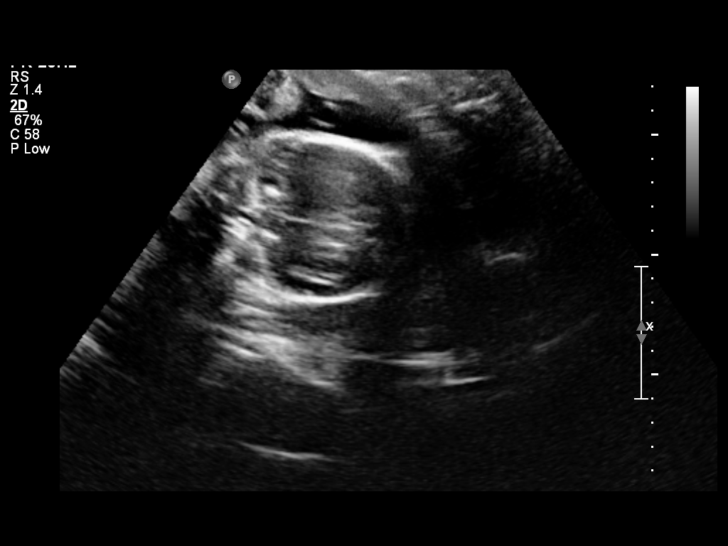
[im 4/14]
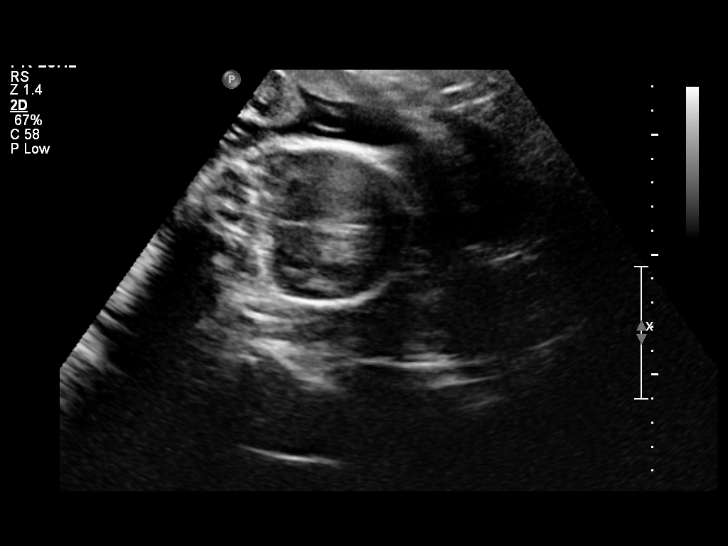
[im 5/14]
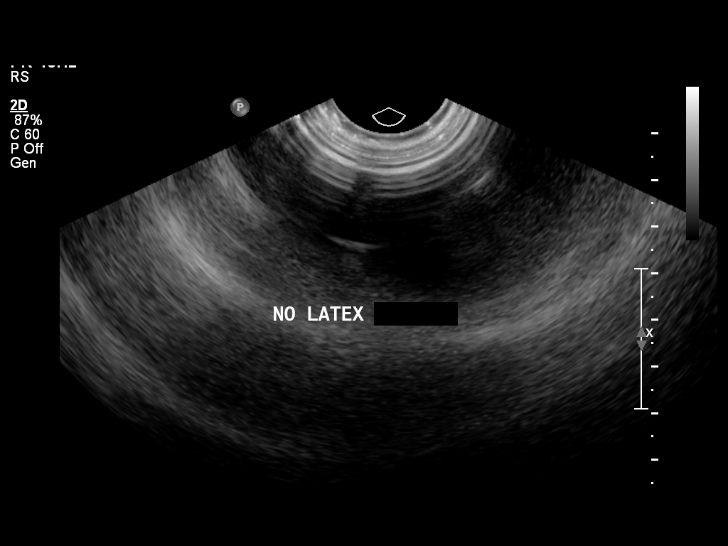
[im 6/14]
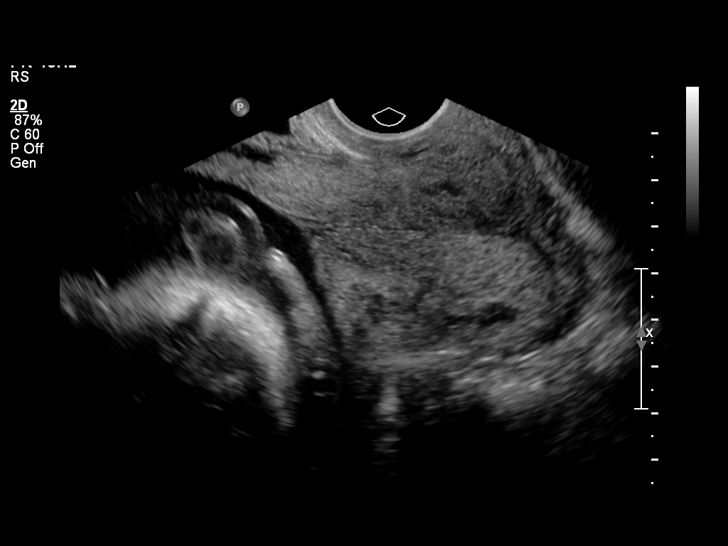
[im 7/14]
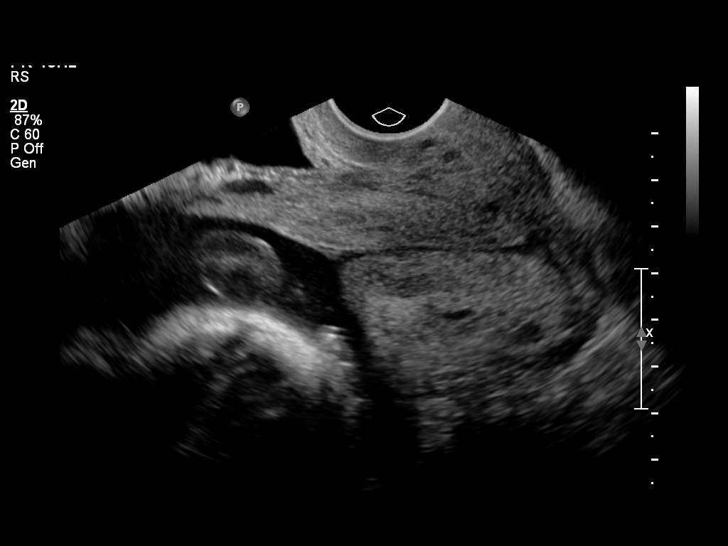
[im 8/14]
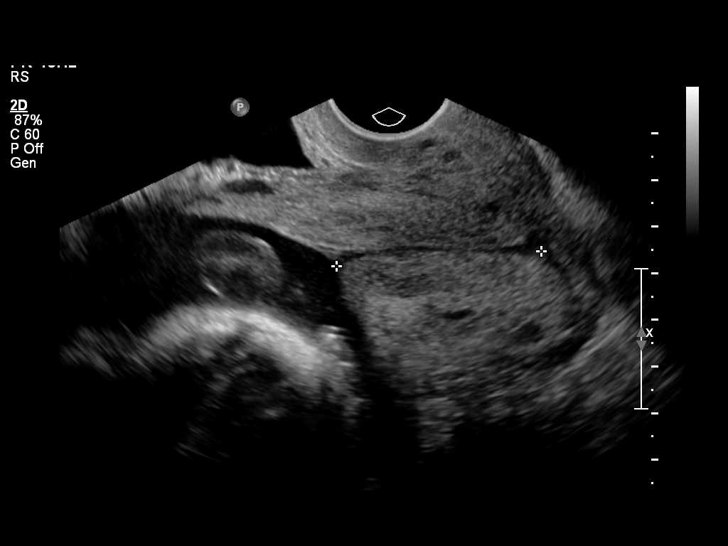
[im 9/14]
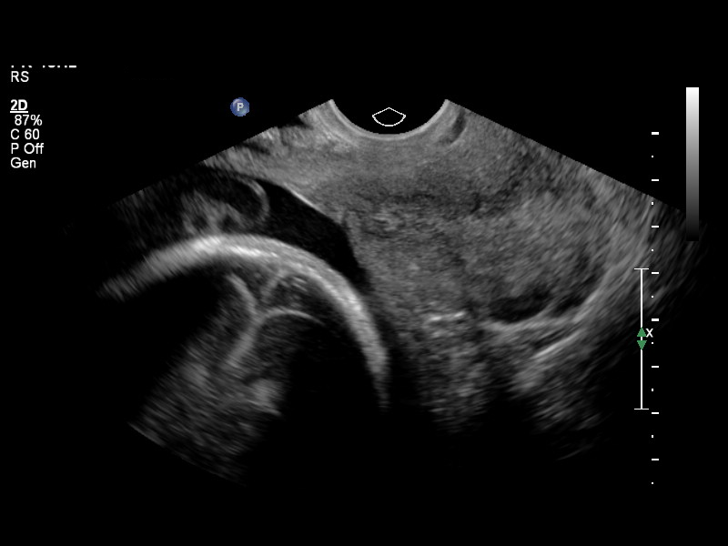
[im 10/14]
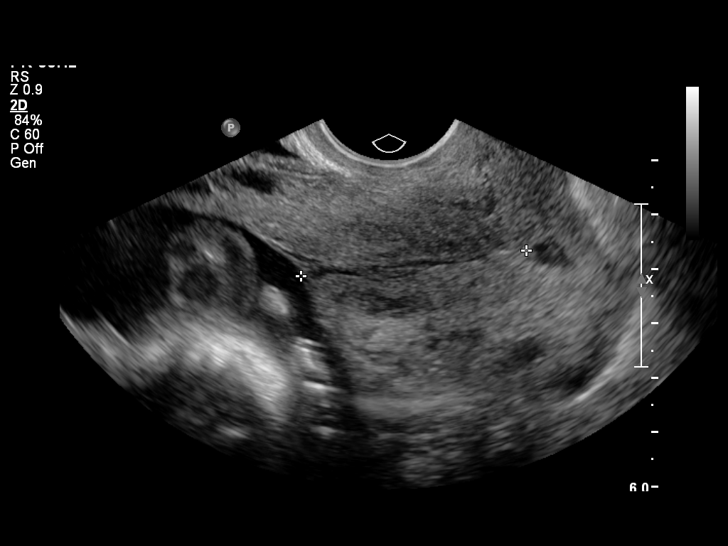
[im 11/14]
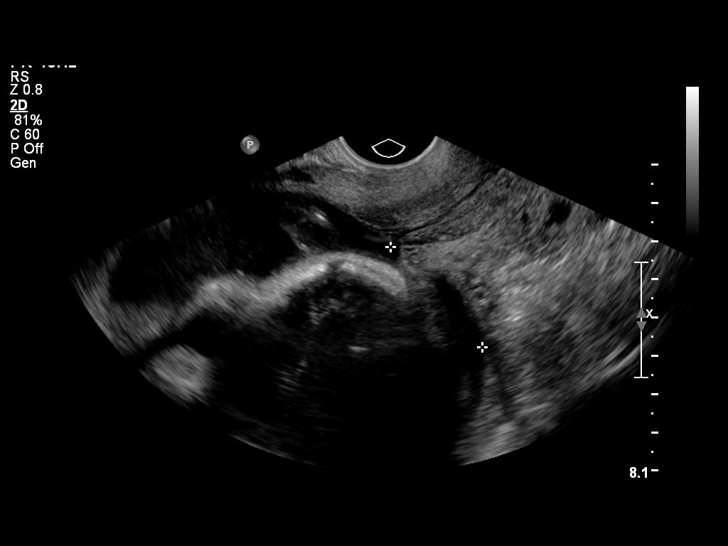
[im 12/14]
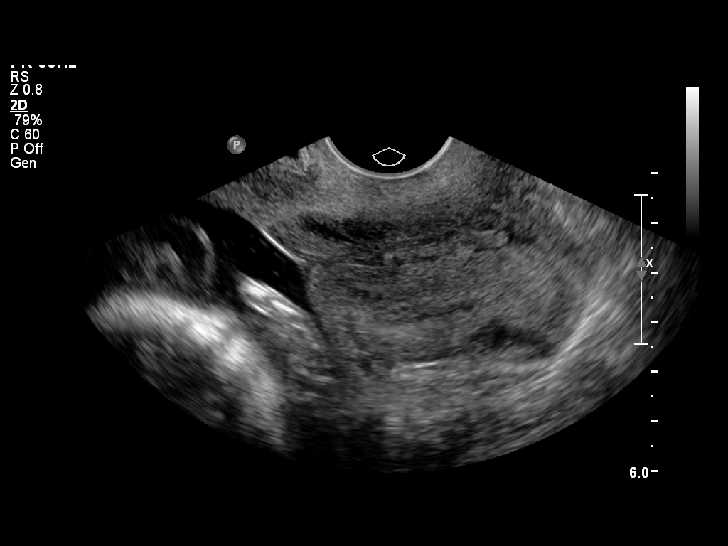
[im 13/14]
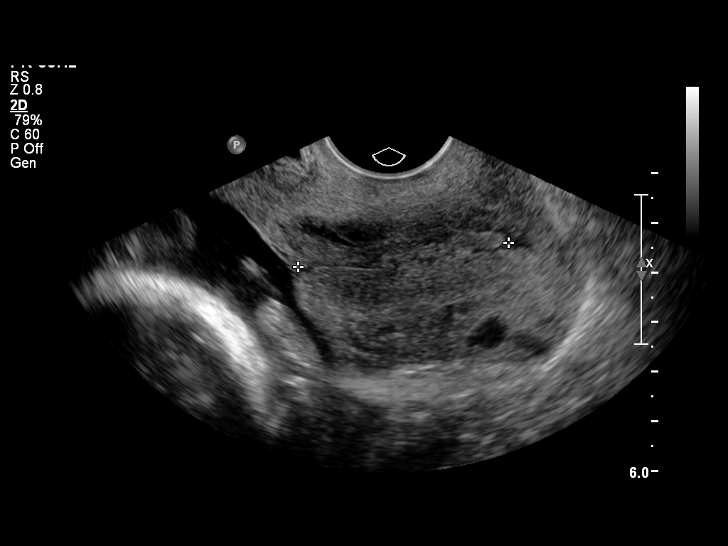
[im 14/14]
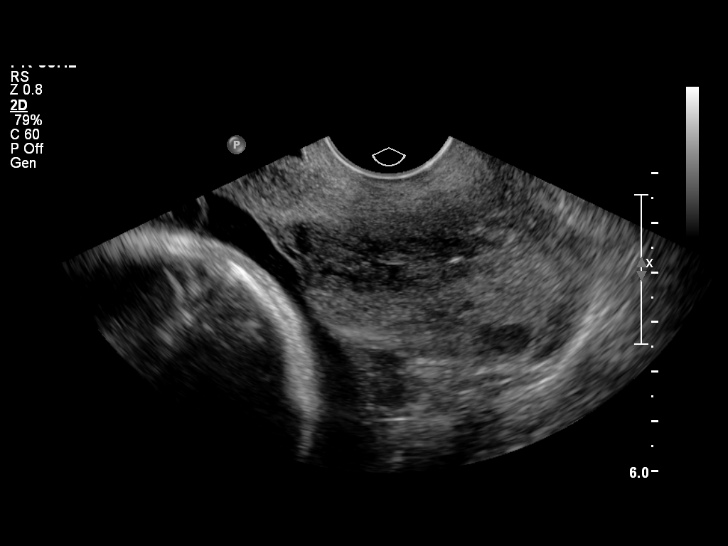

[14 of 14 positions shown; findings below may reference images not displayed]

OBSTETRICS REPORT
(Signed Final 03/12/2015 [DATE])

Name:       SANUSHKA MAXIM                        Visit  03/11/2015 [DATE]
MORALES                              Date:

By:
Service(s) Provided

US MFM OB TRANSVAGINAL                                 76817.2
Indications

Abdominal pain in pregnancy (pain like
contractions today)
26 weeks gestation of pregnancy
Fetal Evaluation

Num Of             1
Fetuses:
Fetal Heart        143                          bpm
Rate:
Cardiac Activity:  Observed
Presentation:      Cephalic, hand presenting
Placenta:          Posterior, above cervical
os (seen TV)
Gestational Age

LMP:           26w 6d        Date:  09/04/14                  EDD:   06/11/15
Best:          26w 6d    Det. By:   LMP  (09/04/14)           EDD:   06/11/15
Cervix Uterus Adnexa

Cervical Length:    4.4       cm

Cervix:       Normal appearance by transvaginal scan
Impression

SIUP at 26+6 weeks
EV views of cervix: normal length without funneling
Recommendations

Follow-up as clinically indicated
A ALILLIOM PETRA with us.  Please do not hesitate to

## 2017-01-09 IMAGING — US US MFM OB FOLLOW-UP
1 series · 14 of 28 positions shown · non-contrast
Comparison: none

[Series 1: us mfm ob follow-up · 31 acquisitions, 14 frames shown]
[im 2/31]
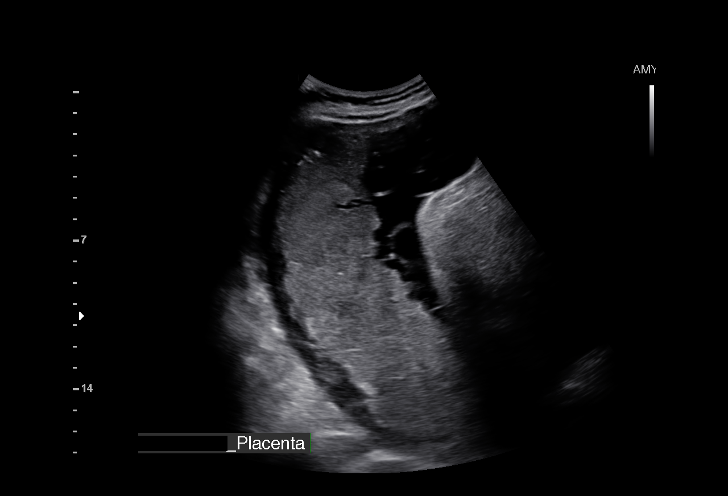
[im 4/31]
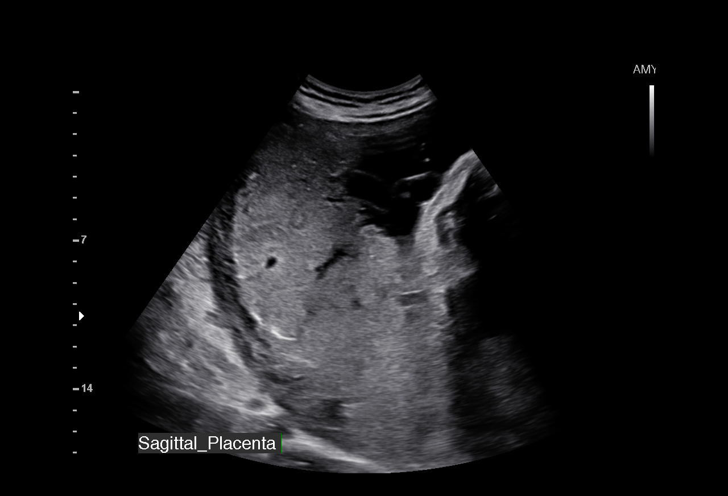
[im 6/31]
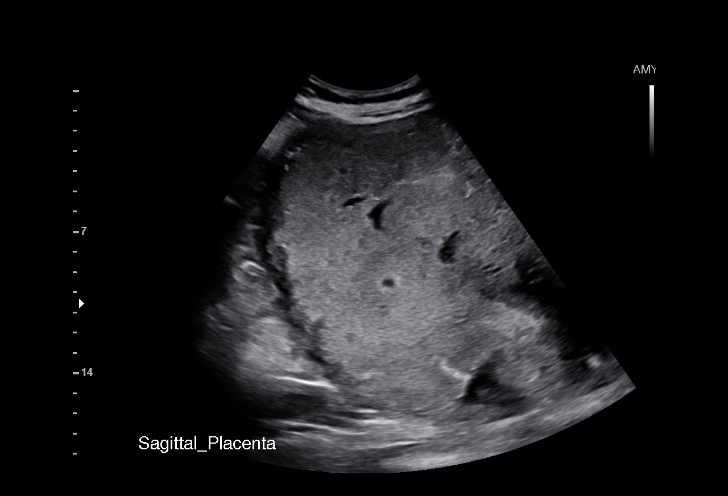
[im 8/31]
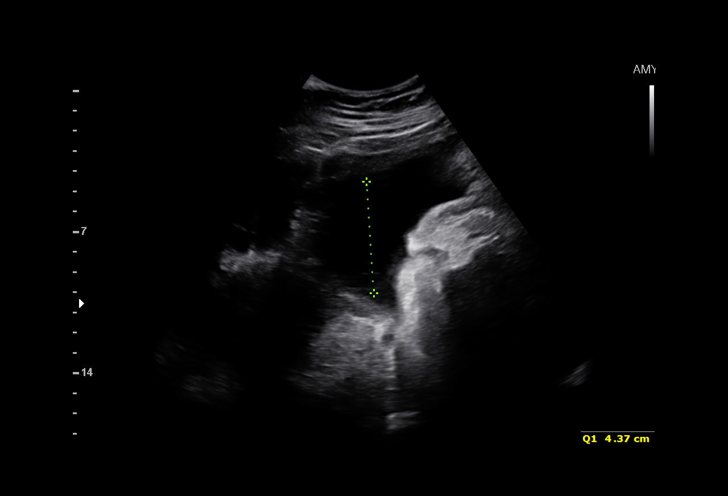
[im 11/31]
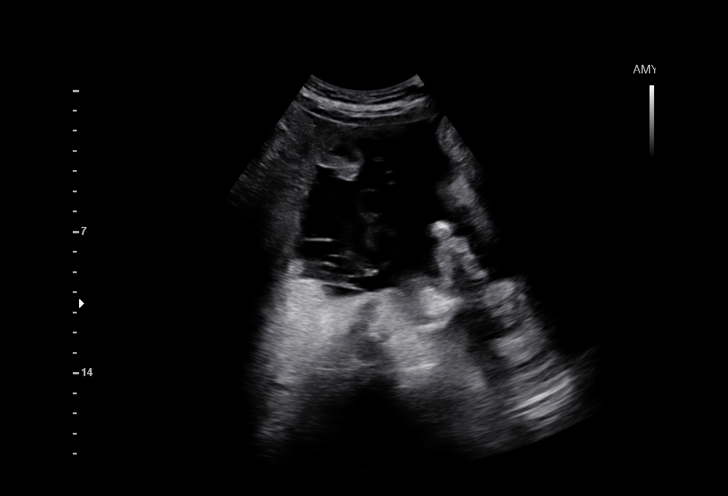
[im 13/31]
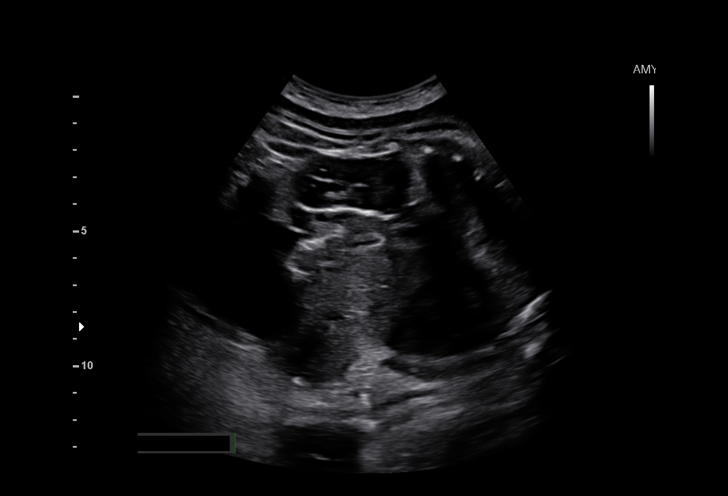
[im 15/31]
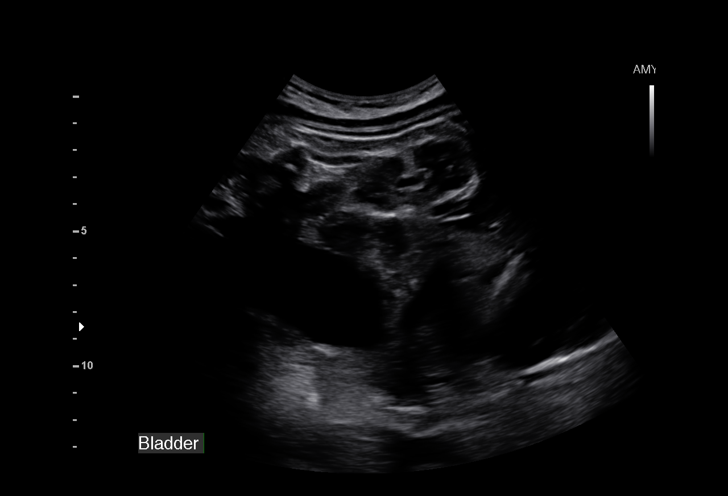
[im 17/31]
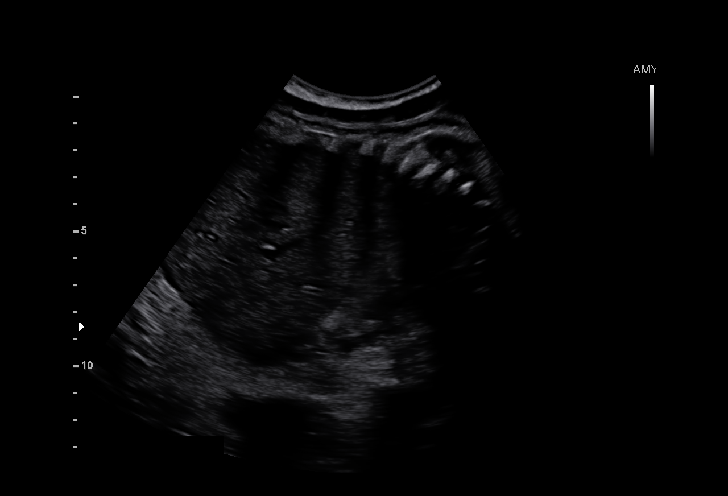
[im 19/31]
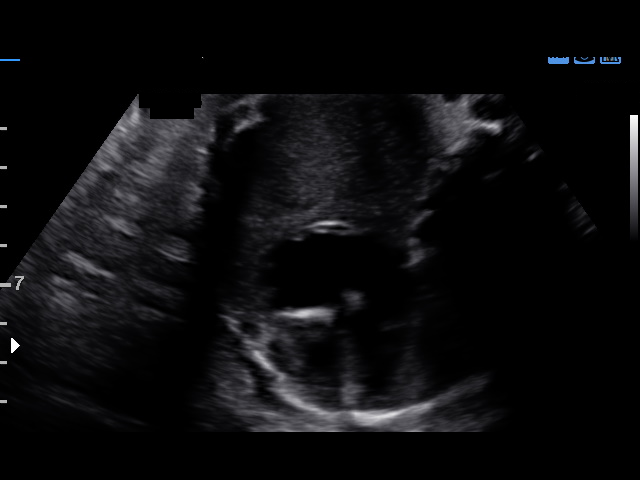
[im 22/31]
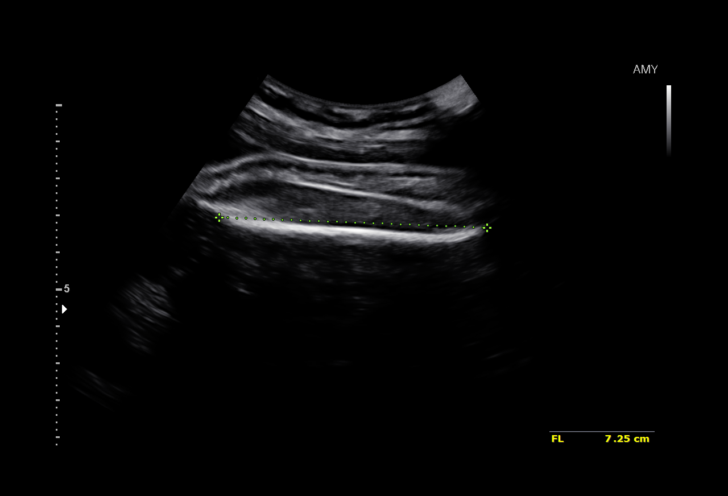
[im 24/31]
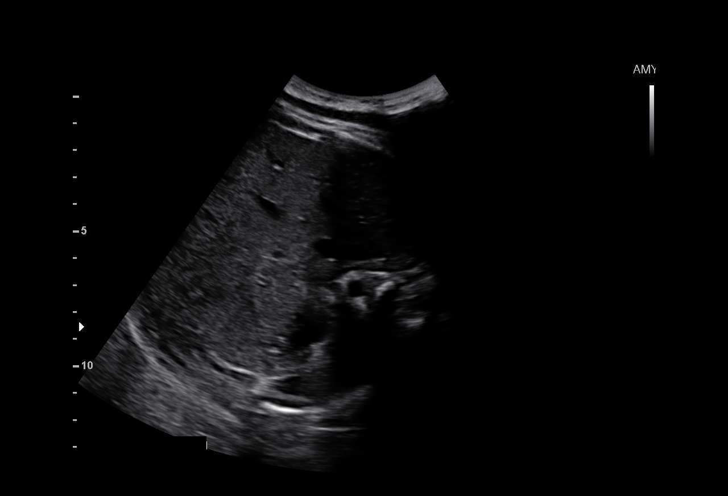
[im 26/31]
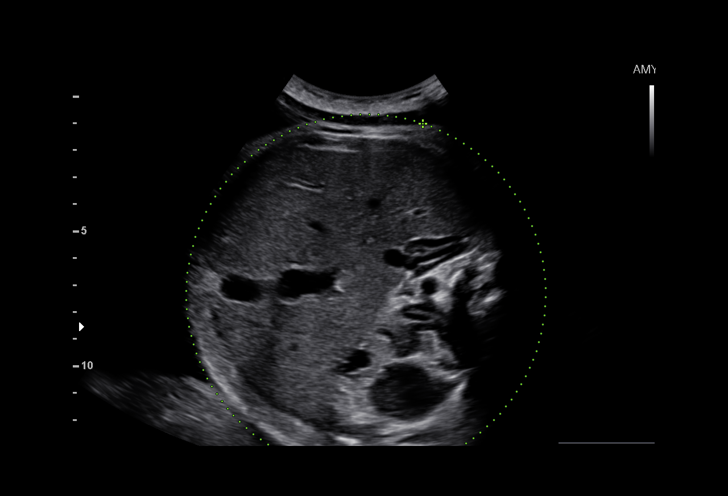
[im 28/31]
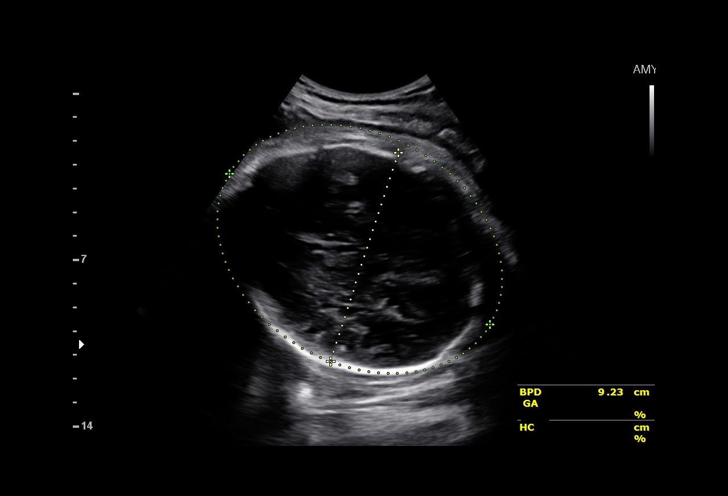
[im 31/31]
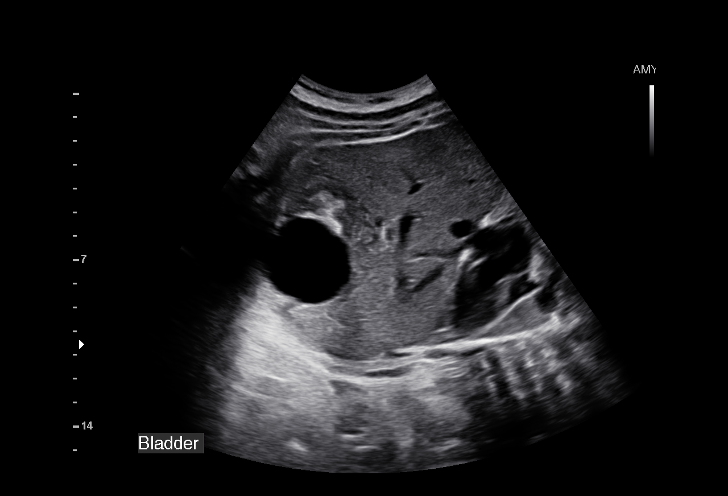

[14 of 28 positions shown; findings below may reference images not displayed]

OBSTETRICS REPORT
(Signed Final 06/03/2015 [DATE])

MORALES                              Date:

Service(s) Provided

Indications

38 weeks gestation of pregnancy
Large for gestational age fetus affecting
management of mother
Fetal Evaluation

Num Of             1
Fetuses:
Fetal Heart        150                          bpm
Rate:
Cardiac Activity:  Observed
Presentation:      Cephalic
Placenta:          Posterior, above cervical
os
P. Cord            Previously Visualized
Insertion:

Amniotic Fluid
AFI FV:      Subjectively within normal limits
AFI Sum:     19.47    cm      80  %Tile     Larg Pckt:    6.35   cm
RUQ:   4.37    cm    RLQ:   6.35    cm   LUQ:    5.53    cm   LLQ:    3.22   cm
Biometry

BPD:     92.7   m    G. Age:   37w 5d                 CI:        69.98   70 - 86
m
FL/HC:      20.3   20.6 -
23.4
HC:     353.5   m    G. Age:   41w 2d        87  %    HC/AC:      0.86   0.87 -
m
AC:     412.5   m    G. Age:   N/A         > 97  %    FL/BPD      77.6   71 - 87
m                                     :
FL:      71.9   m    G. Age:   36w 6d        12  %    FL/AC:      17.4   20 - 24
m
HUM:       65   m    G. Age:   37w 5d        53  %
m
Est.        0860   gm   10 lb 7 oz    > 90   %
FW:
Gestational Age

LMP:           38w 6d        Date:  09/04/14                  EDD:   06/11/15
U/S Today:     38w 4d                                         EDD:   06/13/15
Best:          38w 6d    Det. By:   LMP  (09/04/14)           EDD:   06/11/15
Anatomy

Cranium:          Previously seen        Aortic Arch:       Previously seen
Fetal Cavum:      Previously seen        Ductal Arch:       Previously seen
Ventricles:       Previously seen        Diaphragm:         Appears normal
Choroid Plexus:   Previously seen        Stomach:           Appears normal,
left sided
Cerebellum:       Previously seen        Abdomen:           Previously seen
Posterior         Previously seen        Abdominal          Previously seen
Fossa:                                   Wall:
Nuchal Fold:      Previously seen        Cord Vessels:      Previously seen
Face:             Orbits previously      Kidneys:           Appear normal
seen
Lips:             Previously seen        Bladder:           Appears normal
Heart:            Appears normal         Spine:             Previously seen
(4CH, axis, and
situs)
RVOT:             Previously seen        Lower              Previously seen
Extremities:
LVOT:             Previously seen        Upper              Previously seen
Extremities:

Other:   Male gender. Heels previously seen.
Cervix Uterus Adnexa

Cervix:       Not visualized (advanced GA >60wks)
Impression

Single IUP at 38w 6d
Normal 1-hr OGTT at 28 weeks gestation
The estimated fetal weight today is > 90th %tile. (0860 g)
The AC is > 97th %tile
Normal amniotic fluid volume
Recommendations

At high risk for LGA newborn
While vaginal deliver is permissible in a non-diabetic patient
with an estimated fetal weight < 9111 g, would recommend
that the labor curve be followed closely and would avoid
operative vaginal delivery
Follow up ultrasounds as clinically indicated
TONY HELHOY SEVALLOS with us.  Please do not hesitate to

## 2021-04-19 ENCOUNTER — Ambulatory Visit (INDEPENDENT_AMBULATORY_CARE_PROVIDER_SITE_OTHER): Payer: Self-pay | Admitting: Obstetrics and Gynecology

## 2021-04-19 ENCOUNTER — Other Ambulatory Visit (HOSPITAL_COMMUNITY)
Admission: RE | Admit: 2021-04-19 | Discharge: 2021-04-19 | Disposition: A | Discharge status: Home / Self Care | Payer: Self-pay | Source: Ambulatory Visit | Attending: Obstetrics and Gynecology | Admitting: Obstetrics and Gynecology

## 2021-04-19 ENCOUNTER — Encounter: Payer: Self-pay | Admitting: Obstetrics and Gynecology

## 2021-04-19 ENCOUNTER — Other Ambulatory Visit: Payer: Self-pay

## 2021-04-19 VITALS — BP 110/62 | HR 59 | Ht 66.75 in | Wt 195.0 lb

## 2021-04-19 DIAGNOSIS — N898 Other specified noninflammatory disorders of vagina: Secondary | ICD-10-CM

## 2021-04-19 DIAGNOSIS — Z3009 Encounter for other general counseling and advice on contraception: Secondary | ICD-10-CM

## 2021-04-19 DIAGNOSIS — Z124 Encounter for screening for malignant neoplasm of cervix: Secondary | ICD-10-CM | POA: Insufficient documentation

## 2021-04-19 DIAGNOSIS — Z803 Family history of malignant neoplasm of breast: Secondary | ICD-10-CM

## 2021-04-19 DIAGNOSIS — Z833 Family history of diabetes mellitus: Secondary | ICD-10-CM

## 2021-04-19 DIAGNOSIS — Z01419 Encounter for gynecological examination (general) (routine) without abnormal findings: Secondary | ICD-10-CM

## 2021-04-19 LAB — WET PREP FOR TRICH, YEAST, CLUE

## 2021-04-19 NOTE — Progress Notes (Signed)
30 y.o. G8P3003 Married White or Caucasian Hispanic or Latino female here for annual exam and to establish care.   She had a traumatic experience since having her last child who was 11.5 lbs.  Period Cycle (Days): 28 Period Duration (Days): 7 Period Pattern: Regular Menstrual Flow: Heavy Menstrual Control: Maxi pad Menstrual Control Change Freq (Hours): 2 Dysmenorrhea: (!) Severe Dysmenorrhea Symptoms: Cramping, Other (Comment) (back pain, bloating, breast pain) Cramps start prior to her cycle. Horrible cramps for 2-3 days. Pamprin helps some.  Only using withdrawal for contraception. Corliss Parish will be 7 in couple of months. She is wanting to have a tubal ligation.  Husband won't have a vasectomy.  Not interested in IUD's or any hormones.  No dyspareunia.   She had tearing of her right vulva with the birth of her first son, since then she intermittent discomfort and swelling on the right. "It comes out of place"  Patient's last menstrual period was 04/12/2021.          Sexually active: Yes.    The current method of family planning is none.    Exercising: Yes.    Gym/ health club routine includes cardio. Smoker:  no  Health Maintenance: Pap:12/10/14 normal   04/03/13 normal  History of abnormal Pap:  no MMG:  none  BMD:   none  Colonoscopy: none  TDaP:  11/02/16 Gardasil: none, discussed.    reports that she has never smoked. She has never used smokeless tobacco. She reports that she does not drink alcohol and does not use drugs. Homemaker, almost 41 year old son, 30 year old girl and an 7 year old boy. 2 dogs, poodle and a golden doodle.   Past Medical History:  Diagnosis Date   Asthma    no meds   Recurrent upper respiratory infection (URI)   Only needs albuterol with a URI.   Past Surgical History:  Procedure Laterality Date   TOENAIL EXCISION      Current Outpatient Medications  Medication Sig Dispense Refill   albuterol (PROVENTIL HFA;VENTOLIN HFA) 108 (90 BASE) MCG/ACT  inhaler Inhale 2 puffs into the lungs every 6 (six) hours as needed for wheezing or shortness of breath.     ibuprofen (ADVIL,MOTRIN) 600 MG tablet Take 1 tablet (600 mg total) by mouth every 6 (six) hours. 30 tablet 0   Prenatal Multivit-Min-Fe-FA (PRENATAL VITAMINS PO) Take 1 tablet by mouth daily.     No current facility-administered medications for this visit.    Family History  Problem Relation Age of Onset   Hypertension Mother    Diabetes Mother    Breast cancer Maternal Aunt   Aunt died in her late 28's or early 69's. Not sure if she had genetic testing. Mom is one of 5 girls.   Review of Systems  Genitourinary:  Positive for vaginal discharge.  All other systems reviewed and are negative. She has intermittent itching, occasional odor. Symptoms come and go.   Exam:   BP 110/62   Pulse (!) 59   Ht 5' 6.75" (1.695 m)   Wt 195 lb (88.5 kg)   LMP 04/12/2021   SpO2 98%   BMI 30.77 kg/m   Weight change: @WEIGHTCHANGE @ Height:   Height: 5' 6.75" (169.5 cm)  Ht Readings from Last 3 Encounters:  04/19/21 5' 6.75" (1.695 m)  06/14/15 5\' 5"  (1.651 m)  06/05/15 5\' 4"  (1.626 m)    General appearance: alert, cooperative and appears stated age Head: Normocephalic, without obvious abnormality, atraumatic Neck:  no adenopathy, supple, symmetrical, trachea midline and thyroid normal to inspection and palpation Lungs: clear to auscultation bilaterally Cardiovascular: regular rate and rhythm Breasts: normal appearance, no masses or tenderness Abdomen: soft, non-tender; non distended,  no masses,  no organomegaly Extremities: extremities normal, atraumatic, no cyanosis or edema Skin: Skin color, texture, turgor normal. No rashes or lesions Lymph nodes: Cervical, supraclavicular, and axillary nodes normal. No abnormal inguinal nodes palpated Neurologic: Grossly normal   Pelvic: External genitalia:  no lesions              Urethra:  normal appearing urethra with no masses, tenderness  or lesions              Bartholins and Skenes: normal                 Vagina: normal appearing vagina with normal color and discharge, no lesions              Cervix: no lesions               Bimanual Exam:  Uterus:  normal size, contour, position, consistency, mobility, non-tender and anteverted              Adnexa: no mass, fullness, tenderness               Rectovaginal: Confirms               Anus:  normal sphincter tone, no lesions  Carolynn Serve chaperoned for the exam.   1. Well woman exam Labs with primary Pap today Discussed gardasil, information given  2. Screening for cervical cancer - Cytology - PAP  3. Family history of diabetes mellitus (DM) She reports recent negative lab  4. Family history of breast cancer She is unsure about genetic testing  5. Vaginal discharge - WET PREP FOR TRICH, YEAST, CLUE: negative  6. General counseling and advice on female contraception Interested in sterilization. Not interested in hormones or IUD's Husband declines vasectomy Discussed laparoscopic bilateral salpingectomies, risks reviewed, information given

## 2021-04-19 NOTE — Patient Instructions (Addendum)
EXERCISE   We recommended that you start or continue a regular exercise program for good health. Physical activity is anything that gets your body moving, some is better than none. The CDC recommends 150 minutes per week of Moderate-Intensity Aerobic Activity and 2 or more days of Muscle Strengthening Activity.  Benefits of exercise are limitless: helps weight loss/weight maintenance, improves mood and energy, helps with depression and anxiety, improves sleep, tones and strengthens muscles, improves balance, improves bone density, protects from chronic conditions such as heart disease, high blood pressure and diabetes and so much more. To learn more visit: https://www.cdc.gov/physicalactivity/index.html  DIET: Good nutrition starts with a healthy diet of fruits, vegetables, whole grains, and lean protein sources. Drink plenty of water for hydration. Minimize empty calories, sodium, sweets. For more information about dietary recommendations visit: https://health.gov/our-work/nutrition-physical-activity/dietary-guidelines and https://www.myplate.gov/  ALCOHOL:  Women should limit their alcohol intake to no more than 7 drinks/beers/glasses of wine (combined, not each!) per week. Moderation of alcohol intake to this level decreases your risk of breast cancer and liver damage.  If you are concerned that you may have a problem, or your friends have told you they are concerned about your drinking, there are many resources to help. A well-known program that is free, effective, and available to all people all over the nation is Alcoholics Anonymous.  Check out this site to learn more: https://www.aa.org/   CALCIUM AND VITAMIN D:  Adequate intake of calcium and Vitamin D are recommended for bone health.  You should be getting between 1000-1200 mg of calcium and 800 units of Vitamin D daily between diet and supplements  PAP SMEARS:  Pap smears, to check for cervical cancer or precancers,  have traditionally been  done yearly, scientific advances have shown that most women can have pap smears less often.  However, every woman still should have a physical exam from her gynecologist every year. It will include a breast check, inspection of the vulva and vagina to check for abnormal growths or skin changes, a visual exam of the cervix, and then an exam to evaluate the size and shape of the uterus and ovaries. We will also provide age appropriate advice regarding health maintenance, like when you should have certain vaccines, screening for sexually transmitted diseases, bone density testing, colonoscopy, mammograms, etc.   MAMMOGRAMS:  All women over 40 years old should have a routine mammogram.   COLON CANCER SCREENING: Now recommend starting at age 45. At this time colonoscopy is not covered for routine screening until 50. There are take home tests that can be done between 45-49.   COLONOSCOPY:  Colonoscopy to screen for colon cancer is recommended for all women at age 50.  We know, you hate the idea of the prep.  We agree, BUT, having colon cancer and not knowing it is worse!!  Colon cancer so often starts as a polyp that can be seen and removed at colonscopy, which can quite literally save your life!  And if your first colonoscopy is normal and you have no family history of colon cancer, most women don't have to have it again for 10 years.  Once every ten years, you can do something that may end up saving your life, right?  We will be happy to help you get it scheduled when you are ready.  Be sure to check your insurance coverage so you understand how much it will cost.  It may be covered as a preventative service at no cost, but you should check   your particular policy.      Breast Self-Awareness Breast self-awareness means being familiar with how your breasts look and feel. It involves checking your breasts regularly and reporting any changes to your health care provider. Practicing breast self-awareness is  important. A change in your breasts can be a sign of a serious medical problem. Being familiar with how your breasts look and feel allows you to find any problems early, when treatment is more likely to be successful. All women should practice breast self-awareness, including women who have had breast implants. How to do a breast self-exam One way to learn what is normal for your breasts and whether your breasts are changing is to do a breast self-exam. To do a breast self-exam: Look for Changes  Remove all the clothing above your waist. Stand in front of a mirror in a room with good lighting. Put your hands on your hips. Push your hands firmly downward. Compare your breasts in the mirror. Look for differences between them (asymmetry), such as: Differences in shape. Differences in size. Puckers, dips, and bumps in one breast and not the other. Look at each breast for changes in your skin, such as: Redness. Scaly areas. Look for changes in your nipples, such as: Discharge. Bleeding. Dimpling. Redness. A change in position. Feel for Changes Carefully feel your breasts for lumps and changes. It is best to do this while lying on your back on the floor and again while sitting or standing in the shower or tub with soapy water on your skin. Feel each breast in the following way: Place the arm on the side of the breast you are examining above your head. Feel your breast with the other hand. Start in the nipple area and make  inch (2 cm) overlapping circles to feel your breast. Use the pads of your three middle fingers to do this. Apply light pressure, then medium pressure, then firm pressure. The light pressure will allow you to feel the tissue closest to the skin. The medium pressure will allow you to feel the tissue that is a little deeper. The firm pressure will allow you to feel the tissue close to the ribs. Continue the overlapping circles, moving downward over the breast until you feel your  ribs below your breast. Move one finger-width toward the center of the body. Continue to use the  inch (2 cm) overlapping circles to feel your breast as you move slowly up toward your collarbone. Continue the up and down exam using all three pressures until you reach your armpit.  Write Down What You Find  Write down what is normal for each breast and any changes that you find. Keep a written record with breast changes or normal findings for each breast. By writing this information down, you do not need to depend only on memory for size, tenderness, or location. Write down where you are in your menstrual cycle, if you are still menstruating. If you are having trouble noticing differences in your breasts, do not get discouraged. With time you will become more familiar with the variations in your breasts and more comfortable with the exam. How often should I examine my breasts? Examine your breasts every month. If you are breastfeeding, the best time to examine your breasts is after a feeding or after using a breast pump. If you menstruate, the best time to examine your breasts is 5-7 days after your period is over. During your period, your breasts are lumpier, and it may be more   difficult to notice changes. When should I see my health care provider? See your health care provider if you notice: A change in shape or size of your breasts or nipples. A change in the skin of your breast or nipples, such as a reddened or scaly area. Unusual discharge from your nipples. A lump or thick area that was not there before. Pain in your breasts. Anything that concerns you. Surgery to Prevent Pregnancy Sterilization is surgery to prevent pregnancy. Sterilization is permanent. It should only be done if you are sure that you do not want to have children. For females, the fallopian tubes are either blocked or closed off. When the fallopian tubes are closed, the eggs that the ovaries release cannot enter the  uterus, sperm cannot reach the eggs, and pregnancy is prevented. For males, the vas deferens is cut and then tied or burned (cauterized). The vas deferens is a tube that carries sperm from the testicles. This procedure prevents pregnancy by blocking sperm from going through the vas deferens and penis during ejaculation. Types of sterilization   For females, the surgeries include: Laparoscopic tubal ligation. In this surgery, the fallopian tubes are tied off, sealed with heat, or blocked with a clip, ring, or clamp. A small portion of each fallopian tube may also be removed. This surgery is done through several small cuts (incisions) with special instruments that are inserted into the abdomen. Postpartum tubal ligation. This is also called a mini-laparotomy. This surgery is done right after childbirth or 1 or 2 days after childbirth. In this surgery, the fallopian tubes are tied off, sealed with heat, or blocked with a clip, ring, or clamp. A small portion of each fallopian tube may also be removed. The surgery is done through a single incision in the abdomen. Tubal ligation during a C-section. In this surgery, the fallopian tubes are tied off, sealed with heat, or blocked with a clip, ring, or clamp. A small portion of each fallopian tube may also be removed. The surgery is done at the same time as a C-section delivery. For males, the surgeries include: Incision vasectomy. In this surgery, one or two small incisions are made in the scrotum. The vas deferens will be pulled out of the scrotum and cut. The vas deferens will be tied off or sealed with heat and placed back into your scrotum. The incision will be closed with absorbable stitches (sutures). No scalpel vasectomy. In this surgery, a punctured opening is made in the scrotum. The vas deferens will be pulled out of the scrotum and cut. The vas deferens will be tied off or sealed with heat and placed back into your scrotum. The opening is small and will  not require sutures. What are the benefits of sterilization? It is usually effective for a lifetime. The procedures are generally safe. For females, sterilization does not affect the hormones, like other types of birth control. Because of this, menstrual periods will not be affected. For both males and females, sexual desire and sexual performance will not be affected. What are the disadvantages of sterilization? Risks from the surgery Generally, sterilization is safe. Complications are rare. However, there are some risks. They include: Bleeding. Infection. Reaction to medicine used during the procedure. Injury to surrounding organs. Risks after sterilization After a successful surgery, you may have other problems. Female sterilization risks may include: Failure of the procedure. Sterilization is nearly 100% effective, but it can fail. In rare cases, the fallopian tubes can grow back together over time.  If this happens, a female will be able to get pregnant again. A higher risk of having an ectopic pregnancy. An ectopic pregnancy is a pregnancy that grows outside of the uterus. This kind of pregnancy can lead to serious bleeding if it is not treated. Female sterilization risks may include: Bleeding and swelling of the scrotum. Failure of the procedure. There is a very small chance that the tied or cauterized ends of the vas deferens may reconnect (recanalization). If this happens, a female could still make a female pregnant. Other risks may include: A risk that you may change your mind and decide you want have children. Sterilization may be reversed, but a reversal is not always successful. Lack of protection against sexually transmitted infections (STIs). What happens during the procedure? The steps of the procedure depend on the type of sterilization you are having. The procedure may vary among health care providers and hospitals. Questions to ask your health care provider How effective are  sterilization procedures? What type of procedure is right for me? Is it possible to reverse the procedure if I change my mind? What can I expect after the procedure? Where to find more information [employer]: RoboDrop.com.cy [place] Department of Health and Human Services: ConventionalMedicines.si [facility]: www.urologyhealth.org Summary Sterilization is surgery to prevent pregnancy. There are different types of sterilization surgeries. Sterilization may be reversed, but a reversal is not always successful. Sterilization does not protect against STIs. This information is not intended to replace advice given to you by your health care provider. Make sure you discuss any questions you have with your health care provider. Document Revised: 04/25/2020 Document Reviewed: 04/25/2020 Elsevier Patient Education  2022 [employer].

## 2021-04-23 LAB — CYTOLOGY - PAP
Comment: NEGATIVE
Diagnosis: NEGATIVE
High risk HPV: NEGATIVE

## 2021-08-16 ENCOUNTER — Encounter: Payer: Self-pay | Admitting: Obstetrics and Gynecology

## 2021-08-16 ENCOUNTER — Ambulatory Visit (INDEPENDENT_AMBULATORY_CARE_PROVIDER_SITE_OTHER): Payer: Self-pay | Admitting: Obstetrics and Gynecology

## 2021-08-16 ENCOUNTER — Other Ambulatory Visit: Payer: Self-pay

## 2021-08-16 VITALS — BP 110/62 | HR 72 | Ht 66.0 in | Wt 206.0 lb

## 2021-08-16 DIAGNOSIS — R3915 Urgency of urination: Secondary | ICD-10-CM

## 2021-08-16 NOTE — Progress Notes (Signed)
GYNECOLOGY  VISIT   HPI: 31 y.o.   Married White or Caucasian Hispanic or Latino  female   [phone] with Patient's last menstrual period was 08/02/2021 (approximate).   here for urgency of urination.  She initially started having symptoms in 11/22, she had urinary urgency, frequency and dysuria. She self treated with herbal products. Symptoms improved, then had mild symptoms starting 2-3 weeks ago. Now she has intermittent urinary urgency, intermittent urinary odor. Urine looks cloudy.  No flank pain, no fevers.   She noticed an odor with her last cycle. No baseline odor, no itching, burning or irritation, no increase in vaginal discharge. No odor after intercourse. Declines evaluation for vaginitis.   She reports intermittent bloating.   GYNECOLOGIC HISTORY: Patient's last menstrual period was 08/02/2021 (approximate). Contraception:none  Menopausal hormone therapy: none         OB History     Gravida  3   Para  3   Term  3   Preterm      AB      Living  3      SAB      IAB      Ectopic      Multiple  0   Live Births  3              Patient Active Problem List   Diagnosis Date Noted   NSVD (normal spontaneous vaginal delivery) 06/16/2015   Fetal macrosomia during pregnancy in third trimester 06/16/2015   Labor and delivery indication for care or intervention 06/14/2015   LGA (large for gestational age) fetus affecting mother, antepartum 06/10/2015   Prior fetal macrosomia in third trimester, antepartum 05/27/2015   Low lying placenta without hemorrhage, antepartum    Vulvar dermatitis 12/12/2013   Supervision of normal pregnancy 11/01/2013   Hemorrhoid 12/21/2012   Asthma 08/13/2009    Past Medical History:  Diagnosis Date   Asthma    no meds   Recurrent upper respiratory infection (URI)     Past Surgical History:  Procedure Laterality Date   TOENAIL EXCISION      Current Outpatient Medications  Medication Sig Dispense Refill   albuterol  (PROVENTIL HFA;VENTOLIN HFA) 108 (90 BASE) MCG/ACT inhaler Inhale 2 puffs into the lungs every 6 (six) hours as needed for wheezing or shortness of breath.     ibuprofen (ADVIL,MOTRIN) 600 MG tablet Take 1 tablet (600 mg total) by mouth every 6 (six) hours. 30 tablet 0   No current facility-administered medications for this visit.     ALLERGIES: Patient has no known allergies.  Family History  Problem Relation Age of Onset   Hypertension Mother    Diabetes Mother    Breast cancer Maternal Aunt     Social History   Socioeconomic History   Marital status: Married    Spouse name: Not on file   Number of children: Not on file   Years of education: Not on file   Highest education level: Not on file  Occupational History   Not on file  Tobacco Use   Smoking status: Never   Smokeless tobacco: Never  Substance and Sexual Activity   Alcohol use: No   Drug use: No   Sexual activity: Yes    Birth control/protection: None  Other Topics Concern   Not on file  Social History Narrative   She lives in [place] with husband and 2 children (boy and girl). She is a Agricultural engineer. No one in the house smokes.  Social Determinants of Health   Financial Resource Strain: Not on file  Food Insecurity: Not on file  Transportation Needs: Not on file  Physical Activity: Not on file  Stress: Not on file  Social Connections: Not on file  Intimate Partner Violence: Not on file    Review of Systems  Genitourinary:  Positive for frequency and urgency.  All other systems reviewed and are negative.  PHYSICAL EXAMINATION:    BP 110/62    Pulse 72    Ht 5\' 6"  (1.676 m)    Wt 206 lb (93.4 kg)    LMP 08/02/2021 (Approximate)    SpO2 99%    BMI 33.25 kg/m     General appearance: alert, cooperative and appears stated age CVA: not tender Abdomen: soft, non-tender; non distended, no masses,  no organomegaly  1. Urgency of urination - Urinalysis,Complete w/RFL Culture

## 2021-08-18 LAB — URINALYSIS, COMPLETE W/RFL CULTURE
Bilirubin Urine: NEGATIVE
Glucose, UA: NEGATIVE
Hyaline Cast: NONE SEEN /LPF
Ketones, ur: NEGATIVE
Nitrites, Initial: NEGATIVE
Protein, ur: NEGATIVE
Specific Gravity, Urine: 1.01 (ref 1.001–1.035)
pH: 6 (ref 5.0–8.0)

## 2021-08-18 LAB — URINE CULTURE
MICRO NUMBER:: 12844551
SPECIMEN QUALITY:: ADEQUATE

## 2021-08-18 LAB — CULTURE INDICATED
